# Patient Record
Sex: Female | Born: 1945
Health system: Southern US, Community
[De-identification: ages and names within clinical notes are randomized; demographics above are authoritative.]

---

## 2012-02-15 DIAGNOSIS — T7840XA Allergy, unspecified, initial encounter: Secondary | ICD-10-CM | POA: Diagnosis not present

## 2012-02-15 DIAGNOSIS — I10 Essential (primary) hypertension: Secondary | ICD-10-CM | POA: Diagnosis not present

## 2012-02-15 DIAGNOSIS — Z79899 Other long term (current) drug therapy: Secondary | ICD-10-CM | POA: Diagnosis not present

## 2012-02-15 DIAGNOSIS — T6391XA Toxic effect of contact with unspecified venomous animal, accidental (unintentional), initial encounter: Secondary | ICD-10-CM | POA: Diagnosis not present

## 2012-02-15 DIAGNOSIS — T63461A Toxic effect of venom of wasps, accidental (unintentional), initial encounter: Secondary | ICD-10-CM | POA: Diagnosis not present

## 2012-02-15 DIAGNOSIS — Z9889 Other specified postprocedural states: Secondary | ICD-10-CM | POA: Diagnosis not present

## 2012-02-15 DIAGNOSIS — Z880 Allergy status to penicillin: Secondary | ICD-10-CM | POA: Diagnosis not present

## 2012-03-19 DIAGNOSIS — E7800 Pure hypercholesterolemia, unspecified: Secondary | ICD-10-CM | POA: Diagnosis not present

## 2012-03-19 DIAGNOSIS — R82998 Other abnormal findings in urine: Secondary | ICD-10-CM | POA: Diagnosis not present

## 2012-03-19 DIAGNOSIS — I10 Essential (primary) hypertension: Secondary | ICD-10-CM | POA: Diagnosis not present

## 2012-03-19 DIAGNOSIS — R300 Dysuria: Secondary | ICD-10-CM | POA: Diagnosis not present

## 2012-03-19 DIAGNOSIS — R7309 Other abnormal glucose: Secondary | ICD-10-CM | POA: Diagnosis not present

## 2012-03-28 DIAGNOSIS — Z01818 Encounter for other preprocedural examination: Secondary | ICD-10-CM | POA: Diagnosis not present

## 2012-03-28 DIAGNOSIS — H2589 Other age-related cataract: Secondary | ICD-10-CM | POA: Diagnosis not present

## 2012-04-12 DIAGNOSIS — Z880 Allergy status to penicillin: Secondary | ICD-10-CM | POA: Diagnosis not present

## 2012-04-12 DIAGNOSIS — E7800 Pure hypercholesterolemia, unspecified: Secondary | ICD-10-CM | POA: Diagnosis not present

## 2012-04-12 DIAGNOSIS — I10 Essential (primary) hypertension: Secondary | ICD-10-CM | POA: Diagnosis not present

## 2012-04-12 DIAGNOSIS — Z86718 Personal history of other venous thrombosis and embolism: Secondary | ICD-10-CM | POA: Diagnosis not present

## 2012-04-12 DIAGNOSIS — H2589 Other age-related cataract: Secondary | ICD-10-CM | POA: Diagnosis not present

## 2012-04-12 DIAGNOSIS — H2510 Age-related nuclear cataract, unspecified eye: Secondary | ICD-10-CM | POA: Diagnosis not present

## 2012-04-19 DIAGNOSIS — Z880 Allergy status to penicillin: Secondary | ICD-10-CM | POA: Diagnosis not present

## 2012-04-19 DIAGNOSIS — H2510 Age-related nuclear cataract, unspecified eye: Secondary | ICD-10-CM | POA: Diagnosis not present

## 2012-04-19 DIAGNOSIS — E7800 Pure hypercholesterolemia, unspecified: Secondary | ICD-10-CM | POA: Diagnosis not present

## 2012-04-19 DIAGNOSIS — H2589 Other age-related cataract: Secondary | ICD-10-CM | POA: Diagnosis not present

## 2012-04-19 DIAGNOSIS — I10 Essential (primary) hypertension: Secondary | ICD-10-CM | POA: Diagnosis not present

## 2012-04-20 DIAGNOSIS — H33319 Horseshoe tear of retina without detachment, unspecified eye: Secondary | ICD-10-CM | POA: Diagnosis not present

## 2012-04-20 DIAGNOSIS — Z961 Presence of intraocular lens: Secondary | ICD-10-CM | POA: Diagnosis not present

## 2012-09-25 DIAGNOSIS — R5381 Other malaise: Secondary | ICD-10-CM | POA: Diagnosis not present

## 2012-09-25 DIAGNOSIS — Z9889 Other specified postprocedural states: Secondary | ICD-10-CM | POA: Diagnosis not present

## 2012-09-25 DIAGNOSIS — R5383 Other fatigue: Secondary | ICD-10-CM | POA: Diagnosis not present

## 2012-09-25 DIAGNOSIS — Z124 Encounter for screening for malignant neoplasm of cervix: Secondary | ICD-10-CM | POA: Diagnosis not present

## 2012-09-25 DIAGNOSIS — M1710 Unilateral primary osteoarthritis, unspecified knee: Secondary | ICD-10-CM | POA: Diagnosis not present

## 2012-09-25 DIAGNOSIS — I10 Essential (primary) hypertension: Secondary | ICD-10-CM | POA: Diagnosis not present

## 2012-09-25 DIAGNOSIS — E7800 Pure hypercholesterolemia, unspecified: Secondary | ICD-10-CM | POA: Diagnosis not present

## 2012-09-25 DIAGNOSIS — D179 Benign lipomatous neoplasm, unspecified: Secondary | ICD-10-CM | POA: Diagnosis not present

## 2012-09-25 DIAGNOSIS — R7309 Other abnormal glucose: Secondary | ICD-10-CM | POA: Diagnosis not present

## 2012-09-25 DIAGNOSIS — L259 Unspecified contact dermatitis, unspecified cause: Secondary | ICD-10-CM | POA: Diagnosis not present

## 2012-10-09 DIAGNOSIS — R922 Inconclusive mammogram: Secondary | ICD-10-CM | POA: Diagnosis not present

## 2012-10-09 DIAGNOSIS — Z1231 Encounter for screening mammogram for malignant neoplasm of breast: Secondary | ICD-10-CM | POA: Diagnosis not present

## 2013-02-25 DIAGNOSIS — H43399 Other vitreous opacities, unspecified eye: Secondary | ICD-10-CM | POA: Diagnosis not present

## 2013-02-25 DIAGNOSIS — H33309 Unspecified retinal break, unspecified eye: Secondary | ICD-10-CM | POA: Diagnosis not present

## 2013-02-25 DIAGNOSIS — H5310 Unspecified subjective visual disturbances: Secondary | ICD-10-CM | POA: Diagnosis not present

## 2013-02-25 DIAGNOSIS — Z961 Presence of intraocular lens: Secondary | ICD-10-CM | POA: Diagnosis not present

## 2013-03-25 DIAGNOSIS — M1710 Unilateral primary osteoarthritis, unspecified knee: Secondary | ICD-10-CM | POA: Diagnosis not present

## 2013-03-25 DIAGNOSIS — R7309 Other abnormal glucose: Secondary | ICD-10-CM | POA: Diagnosis not present

## 2013-03-25 DIAGNOSIS — E7800 Pure hypercholesterolemia, unspecified: Secondary | ICD-10-CM | POA: Diagnosis not present

## 2013-03-25 DIAGNOSIS — L259 Unspecified contact dermatitis, unspecified cause: Secondary | ICD-10-CM | POA: Diagnosis not present

## 2013-03-25 DIAGNOSIS — D179 Benign lipomatous neoplasm, unspecified: Secondary | ICD-10-CM | POA: Diagnosis not present

## 2013-03-25 DIAGNOSIS — Z9889 Other specified postprocedural states: Secondary | ICD-10-CM | POA: Diagnosis not present

## 2013-03-25 DIAGNOSIS — I10 Essential (primary) hypertension: Secondary | ICD-10-CM | POA: Diagnosis not present

## 2013-06-12 DIAGNOSIS — H33009 Unspecified retinal detachment with retinal break, unspecified eye: Secondary | ICD-10-CM | POA: Diagnosis not present

## 2013-06-12 DIAGNOSIS — H43819 Vitreous degeneration, unspecified eye: Secondary | ICD-10-CM | POA: Diagnosis not present

## 2013-06-12 DIAGNOSIS — Z961 Presence of intraocular lens: Secondary | ICD-10-CM | POA: Diagnosis not present

## 2013-09-17 DIAGNOSIS — D235 Other benign neoplasm of skin of trunk: Secondary | ICD-10-CM | POA: Diagnosis not present

## 2013-09-26 DIAGNOSIS — M1710 Unilateral primary osteoarthritis, unspecified knee: Secondary | ICD-10-CM | POA: Diagnosis not present

## 2013-09-26 DIAGNOSIS — I10 Essential (primary) hypertension: Secondary | ICD-10-CM | POA: Diagnosis not present

## 2013-09-26 DIAGNOSIS — R7309 Other abnormal glucose: Secondary | ICD-10-CM | POA: Diagnosis not present

## 2013-09-26 DIAGNOSIS — Z124 Encounter for screening for malignant neoplasm of cervix: Secondary | ICD-10-CM | POA: Diagnosis not present

## 2013-09-26 DIAGNOSIS — E7800 Pure hypercholesterolemia, unspecified: Secondary | ICD-10-CM | POA: Diagnosis not present

## 2013-10-14 DIAGNOSIS — E7800 Pure hypercholesterolemia, unspecified: Secondary | ICD-10-CM | POA: Diagnosis not present

## 2013-10-14 DIAGNOSIS — I10 Essential (primary) hypertension: Secondary | ICD-10-CM | POA: Diagnosis not present

## 2013-10-14 DIAGNOSIS — M1710 Unilateral primary osteoarthritis, unspecified knee: Secondary | ICD-10-CM | POA: Diagnosis not present

## 2013-10-14 DIAGNOSIS — R7309 Other abnormal glucose: Secondary | ICD-10-CM | POA: Diagnosis not present

## 2013-12-18 DIAGNOSIS — Z1231 Encounter for screening mammogram for malignant neoplasm of breast: Secondary | ICD-10-CM | POA: Diagnosis not present

## 2014-03-25 DIAGNOSIS — L259 Unspecified contact dermatitis, unspecified cause: Secondary | ICD-10-CM | POA: Diagnosis not present

## 2014-03-25 DIAGNOSIS — E7800 Pure hypercholesterolemia, unspecified: Secondary | ICD-10-CM | POA: Diagnosis not present

## 2014-03-25 DIAGNOSIS — R7309 Other abnormal glucose: Secondary | ICD-10-CM | POA: Diagnosis not present

## 2014-03-25 DIAGNOSIS — Z8742 Personal history of other diseases of the female genital tract: Secondary | ICD-10-CM | POA: Diagnosis not present

## 2014-03-25 DIAGNOSIS — I10 Essential (primary) hypertension: Secondary | ICD-10-CM | POA: Diagnosis not present

## 2014-03-25 DIAGNOSIS — Z9889 Other specified postprocedural states: Secondary | ICD-10-CM | POA: Diagnosis not present

## 2014-03-25 DIAGNOSIS — R195 Other fecal abnormalities: Secondary | ICD-10-CM | POA: Diagnosis not present

## 2014-03-25 DIAGNOSIS — M1710 Unilateral primary osteoarthritis, unspecified knee: Secondary | ICD-10-CM | POA: Diagnosis not present

## 2014-03-25 DIAGNOSIS — D179 Benign lipomatous neoplasm, unspecified: Secondary | ICD-10-CM | POA: Diagnosis not present

## 2014-03-31 DIAGNOSIS — E7800 Pure hypercholesterolemia, unspecified: Secondary | ICD-10-CM | POA: Diagnosis not present

## 2014-03-31 DIAGNOSIS — I10 Essential (primary) hypertension: Secondary | ICD-10-CM | POA: Diagnosis not present

## 2014-03-31 DIAGNOSIS — K921 Melena: Secondary | ICD-10-CM | POA: Diagnosis not present

## 2014-03-31 DIAGNOSIS — R7309 Other abnormal glucose: Secondary | ICD-10-CM | POA: Diagnosis not present

## 2014-06-18 DIAGNOSIS — H5212 Myopia, left eye: Secondary | ICD-10-CM | POA: Diagnosis not present

## 2014-06-18 DIAGNOSIS — Z961 Presence of intraocular lens: Secondary | ICD-10-CM | POA: Diagnosis not present

## 2014-06-18 DIAGNOSIS — H33301 Unspecified retinal break, right eye: Secondary | ICD-10-CM | POA: Diagnosis not present

## 2014-06-18 DIAGNOSIS — H524 Presbyopia: Secondary | ICD-10-CM | POA: Diagnosis not present

## 2014-10-02 DIAGNOSIS — R002 Palpitations: Secondary | ICD-10-CM | POA: Diagnosis not present

## 2014-10-02 DIAGNOSIS — R739 Hyperglycemia, unspecified: Secondary | ICD-10-CM | POA: Diagnosis not present

## 2014-10-02 DIAGNOSIS — E780 Pure hypercholesterolemia: Secondary | ICD-10-CM | POA: Diagnosis not present

## 2014-10-02 DIAGNOSIS — Z01419 Encounter for gynecological examination (general) (routine) without abnormal findings: Secondary | ICD-10-CM | POA: Diagnosis not present

## 2014-10-02 DIAGNOSIS — Z1289 Encounter for screening for malignant neoplasm of other sites: Secondary | ICD-10-CM | POA: Diagnosis not present

## 2014-10-02 DIAGNOSIS — I10 Essential (primary) hypertension: Secondary | ICD-10-CM | POA: Diagnosis not present

## 2014-10-03 DIAGNOSIS — R739 Hyperglycemia, unspecified: Secondary | ICD-10-CM | POA: Diagnosis not present

## 2014-10-03 DIAGNOSIS — R002 Palpitations: Secondary | ICD-10-CM | POA: Diagnosis not present

## 2014-10-03 DIAGNOSIS — E780 Pure hypercholesterolemia: Secondary | ICD-10-CM | POA: Diagnosis not present

## 2014-10-03 DIAGNOSIS — I10 Essential (primary) hypertension: Secondary | ICD-10-CM | POA: Diagnosis not present

## 2014-10-20 DIAGNOSIS — Z79899 Other long term (current) drug therapy: Secondary | ICD-10-CM | POA: Diagnosis not present

## 2014-10-20 DIAGNOSIS — Z1211 Encounter for screening for malignant neoplasm of colon: Secondary | ICD-10-CM | POA: Diagnosis not present

## 2014-12-30 DIAGNOSIS — D751 Secondary polycythemia: Secondary | ICD-10-CM | POA: Diagnosis not present

## 2014-12-30 DIAGNOSIS — Z86718 Personal history of other venous thrombosis and embolism: Secondary | ICD-10-CM | POA: Diagnosis not present

## 2014-12-30 DIAGNOSIS — E279 Disorder of adrenal gland, unspecified: Secondary | ICD-10-CM | POA: Diagnosis not present

## 2014-12-30 DIAGNOSIS — E282 Polycystic ovarian syndrome: Secondary | ICD-10-CM | POA: Diagnosis not present

## 2014-12-30 DIAGNOSIS — R5383 Other fatigue: Secondary | ICD-10-CM | POA: Diagnosis not present

## 2014-12-30 DIAGNOSIS — I10 Essential (primary) hypertension: Secondary | ICD-10-CM | POA: Diagnosis not present

## 2014-12-30 DIAGNOSIS — Z832 Family history of diseases of the blood and blood-forming organs and certain disorders involving the immune mechanism: Secondary | ICD-10-CM | POA: Diagnosis not present

## 2015-01-06 DIAGNOSIS — D751 Secondary polycythemia: Secondary | ICD-10-CM | POA: Diagnosis not present

## 2015-01-06 DIAGNOSIS — J984 Other disorders of lung: Secondary | ICD-10-CM | POA: Diagnosis not present

## 2015-01-12 DIAGNOSIS — Z832 Family history of diseases of the blood and blood-forming organs and certain disorders involving the immune mechanism: Secondary | ICD-10-CM | POA: Diagnosis not present

## 2015-01-12 DIAGNOSIS — D751 Secondary polycythemia: Secondary | ICD-10-CM | POA: Diagnosis not present

## 2015-01-12 DIAGNOSIS — R5383 Other fatigue: Secondary | ICD-10-CM | POA: Diagnosis not present

## 2015-03-23 DIAGNOSIS — R739 Hyperglycemia, unspecified: Secondary | ICD-10-CM | POA: Diagnosis not present

## 2015-03-23 DIAGNOSIS — E780 Pure hypercholesterolemia: Secondary | ICD-10-CM | POA: Diagnosis not present

## 2015-03-23 DIAGNOSIS — R718 Other abnormality of red blood cells: Secondary | ICD-10-CM | POA: Diagnosis not present

## 2015-03-23 DIAGNOSIS — I10 Essential (primary) hypertension: Secondary | ICD-10-CM | POA: Diagnosis not present

## 2015-03-23 DIAGNOSIS — D582 Other hemoglobinopathies: Secondary | ICD-10-CM | POA: Diagnosis not present

## 2015-04-06 DIAGNOSIS — D751 Secondary polycythemia: Secondary | ICD-10-CM | POA: Diagnosis not present

## 2015-04-06 DIAGNOSIS — Z8742 Personal history of other diseases of the female genital tract: Secondary | ICD-10-CM | POA: Diagnosis not present

## 2015-04-06 DIAGNOSIS — Z09 Encounter for follow-up examination after completed treatment for conditions other than malignant neoplasm: Secondary | ICD-10-CM | POA: Diagnosis not present

## 2015-04-20 DIAGNOSIS — Z1231 Encounter for screening mammogram for malignant neoplasm of breast: Secondary | ICD-10-CM | POA: Diagnosis not present

## 2015-07-07 DIAGNOSIS — D751 Secondary polycythemia: Secondary | ICD-10-CM | POA: Diagnosis not present

## 2015-08-06 DIAGNOSIS — D751 Secondary polycythemia: Secondary | ICD-10-CM | POA: Diagnosis not present

## 2015-10-02 DIAGNOSIS — Z91038 Other insect allergy status: Secondary | ICD-10-CM | POA: Diagnosis not present

## 2015-10-02 DIAGNOSIS — Z23 Encounter for immunization: Secondary | ICD-10-CM | POA: Diagnosis not present

## 2015-10-02 DIAGNOSIS — Z1289 Encounter for screening for malignant neoplasm of other sites: Secondary | ICD-10-CM | POA: Diagnosis not present

## 2015-10-02 DIAGNOSIS — R739 Hyperglycemia, unspecified: Secondary | ICD-10-CM | POA: Diagnosis not present

## 2015-10-02 DIAGNOSIS — I10 Essential (primary) hypertension: Secondary | ICD-10-CM | POA: Diagnosis not present

## 2015-10-02 DIAGNOSIS — E7800 Pure hypercholesterolemia, unspecified: Secondary | ICD-10-CM | POA: Diagnosis not present

## 2015-10-02 DIAGNOSIS — D751 Secondary polycythemia: Secondary | ICD-10-CM | POA: Diagnosis not present

## 2015-10-02 DIAGNOSIS — Z0000 Encounter for general adult medical examination without abnormal findings: Secondary | ICD-10-CM | POA: Diagnosis not present

## 2015-10-06 DIAGNOSIS — Z1211 Encounter for screening for malignant neoplasm of colon: Secondary | ICD-10-CM | POA: Diagnosis not present

## 2015-10-06 DIAGNOSIS — Z79899 Other long term (current) drug therapy: Secondary | ICD-10-CM | POA: Diagnosis not present

## 2015-10-06 DIAGNOSIS — Z0000 Encounter for general adult medical examination without abnormal findings: Secondary | ICD-10-CM | POA: Diagnosis not present

## 2015-10-07 DIAGNOSIS — R828 Abnormal findings on cytological and histological examination of urine: Secondary | ICD-10-CM | POA: Diagnosis not present

## 2015-10-20 DIAGNOSIS — D751 Secondary polycythemia: Secondary | ICD-10-CM | POA: Diagnosis not present

## 2015-10-20 DIAGNOSIS — D649 Anemia, unspecified: Secondary | ICD-10-CM | POA: Diagnosis not present

## 2015-10-27 DIAGNOSIS — R828 Abnormal findings on cytological and histological examination of urine: Secondary | ICD-10-CM | POA: Diagnosis not present

## 2015-10-28 DIAGNOSIS — R828 Abnormal findings on cytological and histological examination of urine: Secondary | ICD-10-CM | POA: Diagnosis not present

## 2015-11-03 DIAGNOSIS — H43811 Vitreous degeneration, right eye: Secondary | ICD-10-CM | POA: Diagnosis not present

## 2015-11-03 DIAGNOSIS — H26491 Other secondary cataract, right eye: Secondary | ICD-10-CM | POA: Diagnosis not present

## 2015-11-03 DIAGNOSIS — Z961 Presence of intraocular lens: Secondary | ICD-10-CM | POA: Diagnosis not present

## 2015-11-03 DIAGNOSIS — H33311 Horseshoe tear of retina without detachment, right eye: Secondary | ICD-10-CM | POA: Diagnosis not present

## 2015-11-06 DIAGNOSIS — H43811 Vitreous degeneration, right eye: Secondary | ICD-10-CM | POA: Diagnosis not present

## 2015-11-06 DIAGNOSIS — Z961 Presence of intraocular lens: Secondary | ICD-10-CM | POA: Diagnosis not present

## 2015-11-06 DIAGNOSIS — H33311 Horseshoe tear of retina without detachment, right eye: Secondary | ICD-10-CM | POA: Diagnosis not present

## 2015-11-06 DIAGNOSIS — H26491 Other secondary cataract, right eye: Secondary | ICD-10-CM | POA: Diagnosis not present

## 2015-11-06 DIAGNOSIS — H31001 Unspecified chorioretinal scars, right eye: Secondary | ICD-10-CM | POA: Diagnosis not present

## 2015-11-10 DIAGNOSIS — D751 Secondary polycythemia: Secondary | ICD-10-CM | POA: Diagnosis not present

## 2015-11-10 DIAGNOSIS — D696 Thrombocytopenia, unspecified: Secondary | ICD-10-CM | POA: Diagnosis not present

## 2015-11-19 DIAGNOSIS — Z1211 Encounter for screening for malignant neoplasm of colon: Secondary | ICD-10-CM | POA: Diagnosis not present

## 2015-11-19 DIAGNOSIS — Z79899 Other long term (current) drug therapy: Secondary | ICD-10-CM | POA: Diagnosis not present

## 2015-12-08 DIAGNOSIS — D751 Secondary polycythemia: Secondary | ICD-10-CM | POA: Diagnosis not present

## 2016-01-05 DIAGNOSIS — D751 Secondary polycythemia: Secondary | ICD-10-CM | POA: Diagnosis not present

## 2016-02-02 DIAGNOSIS — D751 Secondary polycythemia: Secondary | ICD-10-CM | POA: Diagnosis not present

## 2016-03-08 DIAGNOSIS — D751 Secondary polycythemia: Secondary | ICD-10-CM | POA: Diagnosis not present

## 2016-03-15 DIAGNOSIS — H33311 Horseshoe tear of retina without detachment, right eye: Secondary | ICD-10-CM | POA: Diagnosis not present

## 2016-03-15 DIAGNOSIS — H26491 Other secondary cataract, right eye: Secondary | ICD-10-CM | POA: Diagnosis not present

## 2016-03-15 DIAGNOSIS — H43811 Vitreous degeneration, right eye: Secondary | ICD-10-CM | POA: Diagnosis not present

## 2016-03-15 DIAGNOSIS — Z961 Presence of intraocular lens: Secondary | ICD-10-CM | POA: Diagnosis not present

## 2016-03-15 DIAGNOSIS — H31001 Unspecified chorioretinal scars, right eye: Secondary | ICD-10-CM | POA: Diagnosis not present

## 2016-03-31 DIAGNOSIS — R739 Hyperglycemia, unspecified: Secondary | ICD-10-CM | POA: Diagnosis not present

## 2016-03-31 DIAGNOSIS — R195 Other fecal abnormalities: Secondary | ICD-10-CM | POA: Diagnosis not present

## 2016-03-31 DIAGNOSIS — I10 Essential (primary) hypertension: Secondary | ICD-10-CM | POA: Diagnosis not present

## 2016-03-31 DIAGNOSIS — E7800 Pure hypercholesterolemia, unspecified: Secondary | ICD-10-CM | POA: Diagnosis not present

## 2016-03-31 DIAGNOSIS — R002 Palpitations: Secondary | ICD-10-CM | POA: Diagnosis not present

## 2016-04-05 DIAGNOSIS — D751 Secondary polycythemia: Secondary | ICD-10-CM | POA: Diagnosis not present

## 2016-05-03 DIAGNOSIS — D751 Secondary polycythemia: Secondary | ICD-10-CM | POA: Diagnosis not present

## 2016-05-31 DIAGNOSIS — D751 Secondary polycythemia: Secondary | ICD-10-CM | POA: Diagnosis not present

## 2016-06-16 DIAGNOSIS — H43813 Vitreous degeneration, bilateral: Secondary | ICD-10-CM | POA: Diagnosis not present

## 2016-06-16 DIAGNOSIS — H33311 Horseshoe tear of retina without detachment, right eye: Secondary | ICD-10-CM | POA: Diagnosis not present

## 2016-06-16 DIAGNOSIS — H527 Unspecified disorder of refraction: Secondary | ICD-10-CM | POA: Diagnosis not present

## 2016-06-16 DIAGNOSIS — Z961 Presence of intraocular lens: Secondary | ICD-10-CM | POA: Diagnosis not present

## 2016-06-17 DIAGNOSIS — Z1231 Encounter for screening mammogram for malignant neoplasm of breast: Secondary | ICD-10-CM | POA: Diagnosis not present

## 2016-06-28 DIAGNOSIS — D751 Secondary polycythemia: Secondary | ICD-10-CM | POA: Diagnosis not present

## 2016-08-12 DIAGNOSIS — D751 Secondary polycythemia: Secondary | ICD-10-CM | POA: Diagnosis not present

## 2016-09-26 DIAGNOSIS — D751 Secondary polycythemia: Secondary | ICD-10-CM | POA: Diagnosis not present

## 2016-10-04 DIAGNOSIS — Z1289 Encounter for screening for malignant neoplasm of other sites: Secondary | ICD-10-CM | POA: Diagnosis not present

## 2016-10-04 DIAGNOSIS — Z23 Encounter for immunization: Secondary | ICD-10-CM | POA: Diagnosis not present

## 2016-10-04 DIAGNOSIS — I10 Essential (primary) hypertension: Secondary | ICD-10-CM | POA: Diagnosis not present

## 2016-10-04 DIAGNOSIS — Z0000 Encounter for general adult medical examination without abnormal findings: Secondary | ICD-10-CM | POA: Diagnosis not present

## 2016-10-04 DIAGNOSIS — D751 Secondary polycythemia: Secondary | ICD-10-CM | POA: Diagnosis not present

## 2016-10-04 DIAGNOSIS — E7800 Pure hypercholesterolemia, unspecified: Secondary | ICD-10-CM | POA: Diagnosis not present

## 2016-10-04 DIAGNOSIS — R739 Hyperglycemia, unspecified: Secondary | ICD-10-CM | POA: Diagnosis not present

## 2016-10-04 DIAGNOSIS — R5383 Other fatigue: Secondary | ICD-10-CM | POA: Diagnosis not present

## 2016-10-05 DIAGNOSIS — R7309 Other abnormal glucose: Secondary | ICD-10-CM | POA: Diagnosis not present

## 2016-12-13 DIAGNOSIS — R42 Dizziness and giddiness: Secondary | ICD-10-CM | POA: Diagnosis not present

## 2016-12-13 DIAGNOSIS — D751 Secondary polycythemia: Secondary | ICD-10-CM | POA: Diagnosis not present

## 2017-02-17 DIAGNOSIS — D751 Secondary polycythemia: Secondary | ICD-10-CM | POA: Diagnosis not present

## 2017-03-17 DIAGNOSIS — D751 Secondary polycythemia: Secondary | ICD-10-CM | POA: Diagnosis not present

## 2017-04-04 DIAGNOSIS — D751 Secondary polycythemia: Secondary | ICD-10-CM | POA: Diagnosis not present

## 2017-04-04 DIAGNOSIS — I10 Essential (primary) hypertension: Secondary | ICD-10-CM | POA: Diagnosis not present

## 2017-04-04 DIAGNOSIS — Z91038 Other insect allergy status: Secondary | ICD-10-CM | POA: Diagnosis not present

## 2017-04-04 DIAGNOSIS — E7800 Pure hypercholesterolemia, unspecified: Secondary | ICD-10-CM | POA: Diagnosis not present

## 2017-04-18 DIAGNOSIS — D751 Secondary polycythemia: Secondary | ICD-10-CM | POA: Diagnosis not present

## 2017-04-18 DIAGNOSIS — Z85828 Personal history of other malignant neoplasm of skin: Secondary | ICD-10-CM | POA: Diagnosis not present

## 2017-04-18 DIAGNOSIS — I10 Essential (primary) hypertension: Secondary | ICD-10-CM | POA: Diagnosis not present

## 2017-04-18 DIAGNOSIS — Z86718 Personal history of other venous thrombosis and embolism: Secondary | ICD-10-CM | POA: Diagnosis not present

## 2017-05-15 DIAGNOSIS — D751 Secondary polycythemia: Secondary | ICD-10-CM | POA: Diagnosis not present

## 2017-06-12 DIAGNOSIS — D751 Secondary polycythemia: Secondary | ICD-10-CM | POA: Diagnosis not present

## 2017-06-28 DIAGNOSIS — H33311 Horseshoe tear of retina without detachment, right eye: Secondary | ICD-10-CM | POA: Diagnosis not present

## 2017-06-28 DIAGNOSIS — Z961 Presence of intraocular lens: Secondary | ICD-10-CM | POA: Diagnosis not present

## 2017-06-28 DIAGNOSIS — H43813 Vitreous degeneration, bilateral: Secondary | ICD-10-CM | POA: Diagnosis not present

## 2017-06-28 DIAGNOSIS — H527 Unspecified disorder of refraction: Secondary | ICD-10-CM | POA: Diagnosis not present

## 2017-07-10 DIAGNOSIS — I10 Essential (primary) hypertension: Secondary | ICD-10-CM | POA: Diagnosis not present

## 2017-07-10 DIAGNOSIS — Z85828 Personal history of other malignant neoplasm of skin: Secondary | ICD-10-CM | POA: Diagnosis not present

## 2017-07-10 DIAGNOSIS — D751 Secondary polycythemia: Secondary | ICD-10-CM | POA: Diagnosis not present

## 2017-09-11 DIAGNOSIS — D751 Secondary polycythemia: Secondary | ICD-10-CM | POA: Diagnosis not present

## 2017-10-10 DIAGNOSIS — D751 Secondary polycythemia: Secondary | ICD-10-CM | POA: Diagnosis not present

## 2017-10-10 DIAGNOSIS — E7800 Pure hypercholesterolemia, unspecified: Secondary | ICD-10-CM | POA: Diagnosis not present

## 2017-10-10 DIAGNOSIS — I10 Essential (primary) hypertension: Secondary | ICD-10-CM | POA: Diagnosis not present

## 2017-10-10 DIAGNOSIS — R5383 Other fatigue: Secondary | ICD-10-CM | POA: Diagnosis not present

## 2017-10-10 DIAGNOSIS — Z1231 Encounter for screening mammogram for malignant neoplasm of breast: Secondary | ICD-10-CM | POA: Diagnosis not present

## 2017-10-10 DIAGNOSIS — R739 Hyperglycemia, unspecified: Secondary | ICD-10-CM | POA: Diagnosis not present

## 2017-10-10 DIAGNOSIS — Z0000 Encounter for general adult medical examination without abnormal findings: Secondary | ICD-10-CM | POA: Diagnosis not present

## 2017-10-12 DIAGNOSIS — E7800 Pure hypercholesterolemia, unspecified: Secondary | ICD-10-CM | POA: Diagnosis not present

## 2017-10-12 DIAGNOSIS — R739 Hyperglycemia, unspecified: Secondary | ICD-10-CM | POA: Diagnosis not present

## 2017-10-12 DIAGNOSIS — D751 Secondary polycythemia: Secondary | ICD-10-CM | POA: Diagnosis not present

## 2017-10-12 DIAGNOSIS — R5383 Other fatigue: Secondary | ICD-10-CM | POA: Diagnosis not present

## 2017-10-26 DIAGNOSIS — G250 Essential tremor: Secondary | ICD-10-CM | POA: Diagnosis not present

## 2017-10-26 DIAGNOSIS — G603 Idiopathic progressive neuropathy: Secondary | ICD-10-CM | POA: Diagnosis not present

## 2017-10-28 DIAGNOSIS — Z1231 Encounter for screening mammogram for malignant neoplasm of breast: Secondary | ICD-10-CM | POA: Diagnosis not present

## 2017-11-06 DIAGNOSIS — D751 Secondary polycythemia: Secondary | ICD-10-CM | POA: Diagnosis not present

## 2017-11-06 DIAGNOSIS — I10 Essential (primary) hypertension: Secondary | ICD-10-CM | POA: Diagnosis not present

## 2017-11-06 DIAGNOSIS — Z832 Family history of diseases of the blood and blood-forming organs and certain disorders involving the immune mechanism: Secondary | ICD-10-CM | POA: Diagnosis not present

## 2018-01-08 DIAGNOSIS — D751 Secondary polycythemia: Secondary | ICD-10-CM | POA: Diagnosis not present

## 2018-01-11 DIAGNOSIS — K1120 Sialoadenitis, unspecified: Secondary | ICD-10-CM | POA: Diagnosis not present

## 2018-02-27 DIAGNOSIS — R739 Hyperglycemia, unspecified: Secondary | ICD-10-CM | POA: Diagnosis not present

## 2018-02-27 DIAGNOSIS — L309 Dermatitis, unspecified: Secondary | ICD-10-CM | POA: Diagnosis not present

## 2018-02-27 DIAGNOSIS — E119 Type 2 diabetes mellitus without complications: Secondary | ICD-10-CM | POA: Diagnosis not present

## 2018-02-27 DIAGNOSIS — E7800 Pure hypercholesterolemia, unspecified: Secondary | ICD-10-CM | POA: Diagnosis not present

## 2018-02-27 DIAGNOSIS — Z91038 Other insect allergy status: Secondary | ICD-10-CM | POA: Diagnosis not present

## 2018-02-27 DIAGNOSIS — D751 Secondary polycythemia: Secondary | ICD-10-CM | POA: Diagnosis not present

## 2018-02-27 DIAGNOSIS — I10 Essential (primary) hypertension: Secondary | ICD-10-CM | POA: Diagnosis not present

## 2018-03-12 DIAGNOSIS — Z6831 Body mass index (BMI) 31.0-31.9, adult: Secondary | ICD-10-CM | POA: Diagnosis not present

## 2018-03-12 DIAGNOSIS — E6609 Other obesity due to excess calories: Secondary | ICD-10-CM | POA: Diagnosis not present

## 2018-03-12 DIAGNOSIS — I10 Essential (primary) hypertension: Secondary | ICD-10-CM | POA: Diagnosis not present

## 2018-03-12 DIAGNOSIS — D751 Secondary polycythemia: Secondary | ICD-10-CM | POA: Diagnosis not present

## 2018-05-14 DIAGNOSIS — D751 Secondary polycythemia: Secondary | ICD-10-CM | POA: Diagnosis not present

## 2018-06-18 DIAGNOSIS — E7800 Pure hypercholesterolemia, unspecified: Secondary | ICD-10-CM | POA: Diagnosis not present

## 2018-06-18 DIAGNOSIS — E119 Type 2 diabetes mellitus without complications: Secondary | ICD-10-CM | POA: Diagnosis not present

## 2018-06-18 DIAGNOSIS — I10 Essential (primary) hypertension: Secondary | ICD-10-CM | POA: Diagnosis not present

## 2018-06-18 DIAGNOSIS — Z91038 Other insect allergy status: Secondary | ICD-10-CM | POA: Diagnosis not present

## 2018-07-12 DIAGNOSIS — D751 Secondary polycythemia: Secondary | ICD-10-CM | POA: Diagnosis not present

## 2018-08-17 DIAGNOSIS — Z961 Presence of intraocular lens: Secondary | ICD-10-CM | POA: Diagnosis not present

## 2018-08-17 DIAGNOSIS — H33311 Horseshoe tear of retina without detachment, right eye: Secondary | ICD-10-CM | POA: Diagnosis not present

## 2018-08-17 DIAGNOSIS — H43813 Vitreous degeneration, bilateral: Secondary | ICD-10-CM | POA: Diagnosis not present

## 2018-08-17 DIAGNOSIS — H524 Presbyopia: Secondary | ICD-10-CM | POA: Diagnosis not present

## 2018-09-12 DIAGNOSIS — Z85828 Personal history of other malignant neoplasm of skin: Secondary | ICD-10-CM | POA: Diagnosis not present

## 2018-09-12 DIAGNOSIS — Z6828 Body mass index (BMI) 28.0-28.9, adult: Secondary | ICD-10-CM | POA: Diagnosis not present

## 2018-09-12 DIAGNOSIS — D751 Secondary polycythemia: Secondary | ICD-10-CM | POA: Diagnosis not present

## 2018-09-12 DIAGNOSIS — E282 Polycystic ovarian syndrome: Secondary | ICD-10-CM | POA: Diagnosis not present

## 2018-09-12 DIAGNOSIS — Z86718 Personal history of other venous thrombosis and embolism: Secondary | ICD-10-CM | POA: Diagnosis not present

## 2018-09-12 DIAGNOSIS — I10 Essential (primary) hypertension: Secondary | ICD-10-CM | POA: Diagnosis not present

## 2018-09-12 DIAGNOSIS — E669 Obesity, unspecified: Secondary | ICD-10-CM | POA: Diagnosis not present

## 2018-10-12 DIAGNOSIS — E1169 Type 2 diabetes mellitus with other specified complication: Secondary | ICD-10-CM | POA: Diagnosis not present

## 2018-10-12 DIAGNOSIS — Z1231 Encounter for screening mammogram for malignant neoplasm of breast: Secondary | ICD-10-CM | POA: Diagnosis not present

## 2018-10-12 DIAGNOSIS — R5383 Other fatigue: Secondary | ICD-10-CM | POA: Diagnosis not present

## 2018-10-12 DIAGNOSIS — I10 Essential (primary) hypertension: Secondary | ICD-10-CM | POA: Diagnosis not present

## 2018-10-12 DIAGNOSIS — Z0000 Encounter for general adult medical examination without abnormal findings: Secondary | ICD-10-CM | POA: Diagnosis not present

## 2018-10-12 DIAGNOSIS — Z91038 Other insect allergy status: Secondary | ICD-10-CM | POA: Diagnosis not present

## 2018-10-12 DIAGNOSIS — D751 Secondary polycythemia: Secondary | ICD-10-CM | POA: Diagnosis not present

## 2018-10-12 DIAGNOSIS — E7800 Pure hypercholesterolemia, unspecified: Secondary | ICD-10-CM | POA: Diagnosis not present

## 2018-10-12 DIAGNOSIS — E1140 Type 2 diabetes mellitus with diabetic neuropathy, unspecified: Secondary | ICD-10-CM | POA: Diagnosis not present

## 2018-10-15 DIAGNOSIS — E1169 Type 2 diabetes mellitus with other specified complication: Secondary | ICD-10-CM | POA: Diagnosis not present

## 2018-10-15 DIAGNOSIS — E7800 Pure hypercholesterolemia, unspecified: Secondary | ICD-10-CM | POA: Diagnosis not present

## 2018-10-15 DIAGNOSIS — R5383 Other fatigue: Secondary | ICD-10-CM | POA: Diagnosis not present

## 2018-10-15 DIAGNOSIS — D751 Secondary polycythemia: Secondary | ICD-10-CM | POA: Diagnosis not present

## 2018-10-15 DIAGNOSIS — I10 Essential (primary) hypertension: Secondary | ICD-10-CM | POA: Diagnosis not present

## 2018-10-22 DIAGNOSIS — D751 Secondary polycythemia: Secondary | ICD-10-CM | POA: Diagnosis not present

## 2018-11-26 DIAGNOSIS — L638 Other alopecia areata: Secondary | ICD-10-CM | POA: Diagnosis not present

## 2018-11-28 DIAGNOSIS — E559 Vitamin D deficiency, unspecified: Secondary | ICD-10-CM | POA: Diagnosis not present

## 2018-11-28 DIAGNOSIS — D510 Vitamin B12 deficiency anemia due to intrinsic factor deficiency: Secondary | ICD-10-CM | POA: Diagnosis not present

## 2018-11-28 DIAGNOSIS — R531 Weakness: Secondary | ICD-10-CM | POA: Diagnosis not present

## 2018-12-10 DIAGNOSIS — L638 Other alopecia areata: Secondary | ICD-10-CM | POA: Diagnosis not present

## 2019-01-01 DIAGNOSIS — Z1231 Encounter for screening mammogram for malignant neoplasm of breast: Secondary | ICD-10-CM | POA: Diagnosis not present

## 2019-01-16 DIAGNOSIS — D751 Secondary polycythemia: Secondary | ICD-10-CM | POA: Diagnosis not present

## 2019-02-22 DIAGNOSIS — R3915 Urgency of urination: Secondary | ICD-10-CM | POA: Diagnosis not present

## 2019-02-22 DIAGNOSIS — R350 Frequency of micturition: Secondary | ICD-10-CM | POA: Diagnosis not present

## 2019-02-22 DIAGNOSIS — I10 Essential (primary) hypertension: Secondary | ICD-10-CM | POA: Diagnosis not present

## 2019-02-22 DIAGNOSIS — G629 Polyneuropathy, unspecified: Secondary | ICD-10-CM | POA: Diagnosis not present

## 2019-02-22 DIAGNOSIS — E7800 Pure hypercholesterolemia, unspecified: Secondary | ICD-10-CM | POA: Diagnosis not present

## 2019-02-22 DIAGNOSIS — E1140 Type 2 diabetes mellitus with diabetic neuropathy, unspecified: Secondary | ICD-10-CM | POA: Diagnosis not present

## 2019-02-22 DIAGNOSIS — R300 Dysuria: Secondary | ICD-10-CM | POA: Diagnosis not present

## 2019-03-13 DIAGNOSIS — R42 Dizziness and giddiness: Secondary | ICD-10-CM | POA: Diagnosis not present

## 2019-03-13 DIAGNOSIS — D751 Secondary polycythemia: Secondary | ICD-10-CM | POA: Diagnosis not present

## 2019-03-13 DIAGNOSIS — I10 Essential (primary) hypertension: Secondary | ICD-10-CM | POA: Diagnosis not present

## 2019-05-14 DIAGNOSIS — D751 Secondary polycythemia: Secondary | ICD-10-CM | POA: Diagnosis not present

## 2019-07-03 DIAGNOSIS — D751 Secondary polycythemia: Secondary | ICD-10-CM | POA: Diagnosis not present

## 2019-07-03 DIAGNOSIS — I10 Essential (primary) hypertension: Secondary | ICD-10-CM | POA: Diagnosis not present

## 2019-07-03 DIAGNOSIS — L309 Dermatitis, unspecified: Secondary | ICD-10-CM | POA: Diagnosis not present

## 2019-07-03 DIAGNOSIS — G629 Polyneuropathy, unspecified: Secondary | ICD-10-CM | POA: Diagnosis not present

## 2019-07-11 DIAGNOSIS — M25552 Pain in left hip: Secondary | ICD-10-CM | POA: Diagnosis not present

## 2019-07-15 DIAGNOSIS — M1612 Unilateral primary osteoarthritis, left hip: Secondary | ICD-10-CM | POA: Diagnosis not present

## 2019-07-15 DIAGNOSIS — M24152 Other articular cartilage disorders, left hip: Secondary | ICD-10-CM | POA: Diagnosis not present

## 2019-07-15 DIAGNOSIS — M25552 Pain in left hip: Secondary | ICD-10-CM | POA: Diagnosis not present

## 2019-07-16 DIAGNOSIS — D751 Secondary polycythemia: Secondary | ICD-10-CM | POA: Diagnosis not present

## 2019-07-26 DIAGNOSIS — M25552 Pain in left hip: Secondary | ICD-10-CM | POA: Diagnosis not present

## 2019-08-23 DIAGNOSIS — H43813 Vitreous degeneration, bilateral: Secondary | ICD-10-CM | POA: Diagnosis not present

## 2019-08-23 DIAGNOSIS — Z961 Presence of intraocular lens: Secondary | ICD-10-CM | POA: Diagnosis not present

## 2019-08-23 DIAGNOSIS — H524 Presbyopia: Secondary | ICD-10-CM | POA: Diagnosis not present

## 2019-08-23 DIAGNOSIS — H33311 Horseshoe tear of retina without detachment, right eye: Secondary | ICD-10-CM | POA: Diagnosis not present

## 2019-09-02 ENCOUNTER — Ambulatory Visit (INDEPENDENT_AMBULATORY_CARE_PROVIDER_SITE_OTHER): Payer: Medicare Other

## 2019-09-02 ENCOUNTER — Other Ambulatory Visit: Payer: Self-pay

## 2019-09-02 ENCOUNTER — Ambulatory Visit (INDEPENDENT_AMBULATORY_CARE_PROVIDER_SITE_OTHER): Payer: Medicare Other | Admitting: Family Medicine

## 2019-09-02 ENCOUNTER — Encounter: Payer: Self-pay | Admitting: Family Medicine

## 2019-09-02 VITALS — BP 150/90 | HR 79 | Ht 61.0 in | Wt 153.0 lb

## 2019-09-02 DIAGNOSIS — S73192A Other sprain of left hip, initial encounter: Secondary | ICD-10-CM

## 2019-09-02 DIAGNOSIS — M25552 Pain in left hip: Secondary | ICD-10-CM | POA: Insufficient documentation

## 2019-09-02 DIAGNOSIS — S73199A Other sprain of unspecified hip, initial encounter: Secondary | ICD-10-CM | POA: Insufficient documentation

## 2019-09-02 NOTE — Progress Notes (Signed)
[facility] [address] [place] Phone: [phone] Subjective:   I Shelley Small am serving as a Education administrator for Dr. Hulan Saas.  This visit occurred during the SARS-CoV-2 public health emergency.  Safety protocols were in place, including screening questions prior to the visit, additional usage of staff PPE, and extensive cleaning of exam room while observing appropriate contact time as indicated for disinfecting solutions.    CC: Left hip pain  [id number]  Shelley Small is a 74 y.o. female coming in with complaint of left hip pain. Patient states her his pain is chronic. Going down stairs is most painful sitting down makes it feel better. First step of the morning is painful. No prior injury. Has had an MRI that was normal. States she has to "push the hip in" in order to walk. Believes she has piriformis syndrome. Has done exercises for the pain. Proximal hamstring. Pain scale  10/10 at its worse. Pain feels deep and states that her groin area cramps when her pain is at its worse.        Social History   Socioeconomic History  . Marital status: Widowed    Spouse name: Not on file  . Number of children: Not on file  . Years of education: Not on file  . Highest education level: Not on file  Occupational History  . Not on file  Tobacco Use  . Smoking status: Not on file  Substance and Sexual Activity  . Alcohol use: Not on file  . Drug use: Not on file  . Sexual activity: Not on file  Other Topics Concern  . Not on file  Social History Narrative  . Not on file   Social Determinants of Health   Financial Resource Strain:   . Difficulty of Paying Living Expenses: Not on file  Food Insecurity:   . Worried About Charity fundraiser in the Last Year: Not on file  . Ran Out of Food in the Last Year: Not on file  Transportation Needs:   . Lack of Transportation (Medical): Not on file  . Lack of Transportation (Non-Medical):  Not on file  Physical Activity:   . Days of Exercise per Week: Not on file  . Minutes of Exercise per Session: Not on file  Stress:   . Feeling of Stress : Not on file  Social Connections:   . Frequency of Communication with Friends and Family: Not on file  . Frequency of Social Gatherings with Friends and Family: Not on file  . Attends Religious Services: Not on file  . Active Member of Clubs or Organizations: Not on file  . Attends Archivist Meetings: Not on file  . Marital Status: Not on file   Not on File No family history on file. No current outpatient medications on file.    Past medical history, social, surgical and family history all reviewed in electronic medical record.  No pertanent information unless stated regarding to the chief complaint.   Review of Systems:  No headache, visual changes, nausea, vomiting, diarrhea, constipation, dizziness, abdominal pain, skin rash, fevers, chills, night sweats, weight loss, swollen lymph nodes, body aches, joint swelling, chest pain, shortness of breath, mood changes. POSITIVE muscle aches  Objective  Blood pressure (!) 150/90, pulse 79, height 5\' 1"  (1.549 m), weight 153 lb (69.4 kg), SpO2 98 %.   General: No apparent distress alert and oriented x3 mood and affect normal, dressed appropriately.  Mild  masked facies HEENT: Pupils equal, extraocular movements intact  Respiratory: Patient's speak in full sentences and does not appear short of breath  Cardiovascular: No lower extremity edema, non tender, no erythema  Skin: Warm dry intact with no signs of infection or rash on extremities or on axial skeleton.  Abdomen: Soft nontender  Neuro: Cranial nerves II through XII are intact, neurovascularly intact in all extremities with 2+ DTRs and 2+ pulses.  Lymph: No lymphadenopathy of posterior or anterior cervical chain or axillae bilaterally.  Gait antalgic MSK: Mild arthritic changes of multiple joints  Left hip exam shows  the patient is nontender to palpation over the proximal hip joint.  Patient has negative straight leg test.  Mild pain in with internal range of motion. 4 out of 5 strength of the hip flexion compared to the contralateral side.  [id number]; 15 additional minutes spent for Therapeutic exercises as stated in above notes.  This included exercises focusing on stretching, strengthening, with significant focus on eccentric aspects.   Long term goals include an improvement in range of motion, strength, endurance as well as avoiding reinjury. Patient's frequency would include in 1-2 times a day, 3-5 times a week for a duration of 6-12 weeks. Hip strengthening exercises which included:  Pelvic tilt/bracing to help with proper recruitment of the lower abs and pelvic floor muscles  Glute strengthening to properly contract glutes without over-engaging low back and hamstrings - prone hip extension and glute bridge exercises Proper stretching techniques to increase effectiveness for the hip flexors, groin, quads, piriformic and low back when appropriate   Proper technique shown and discussed handout in great detail with ATC.  All questions were discussed and answered.   Total time spent with patient today 55 minutes which includes review of external imaging and notes.   Impression and Recommendations:     This case required medical decision making of moderate complexity. The above documentation has been reviewed and is accurate and complete Shelley Pulley, DO       Note: This dictation was prepared with Dragon dictation along with smaller phrase technology. Any transcriptional errors that result from this process are unintentional.

## 2019-09-02 NOTE — Assessment & Plan Note (Signed)
New problem.  Left hand pain.  Patient did have an MRI that was independently visualized by me from an external source.  Patient brought in the CD.  Patient did have a degenerative labral tear noted anteriorly that can go posteriorly.  Mild to moderate arthritic changes of the joint space.  This will be a chronic issue.  We discussed with patient about different treatment options.  Patient wants to avoid formal physical therapy at the moment.  We discussed icing regimen, home exercises, which activities to doing which wants to avoid.  Patient is to increase activity slowly over the course the next several weeks.  Discussed over-the-counter medications.  Worsening symptoms will consider injection.

## 2019-09-02 NOTE — Patient Instructions (Signed)
Good to see you Exercise 3 times a week Thigh compression sleeve with a lot of walking Tart cherry extract 1200mg  at night Vitamin D 4000 IU daily  See me again in 4-6 weeks

## 2019-09-13 DIAGNOSIS — I10 Essential (primary) hypertension: Secondary | ICD-10-CM | POA: Diagnosis not present

## 2019-09-13 DIAGNOSIS — D751 Secondary polycythemia: Secondary | ICD-10-CM | POA: Diagnosis not present

## 2019-09-13 DIAGNOSIS — R42 Dizziness and giddiness: Secondary | ICD-10-CM | POA: Diagnosis not present

## 2019-09-13 DIAGNOSIS — Z79899 Other long term (current) drug therapy: Secondary | ICD-10-CM | POA: Diagnosis not present

## 2019-09-13 DIAGNOSIS — L659 Nonscarring hair loss, unspecified: Secondary | ICD-10-CM | POA: Diagnosis not present

## 2019-10-03 ENCOUNTER — Ambulatory Visit (INDEPENDENT_AMBULATORY_CARE_PROVIDER_SITE_OTHER): Payer: Medicare Other

## 2019-10-03 ENCOUNTER — Other Ambulatory Visit: Payer: Self-pay

## 2019-10-03 ENCOUNTER — Encounter: Payer: Self-pay | Admitting: Family Medicine

## 2019-10-03 ENCOUNTER — Ambulatory Visit (INDEPENDENT_AMBULATORY_CARE_PROVIDER_SITE_OTHER): Payer: Medicare Other | Admitting: Family Medicine

## 2019-10-03 VITALS — BP 142/82 | HR 120 | Ht 61.0 in | Wt 153.0 lb

## 2019-10-03 DIAGNOSIS — S76312A Strain of muscle, fascia and tendon of the posterior muscle group at thigh level, left thigh, initial encounter: Secondary | ICD-10-CM

## 2019-10-03 DIAGNOSIS — S73192D Other sprain of left hip, subsequent encounter: Secondary | ICD-10-CM | POA: Diagnosis not present

## 2019-10-03 NOTE — Patient Instructions (Addendum)
Injected piriformis Listen to your body See me again in 8 weeks

## 2019-10-03 NOTE — Assessment & Plan Note (Signed)
Injected today. Hoping causing some of the pain. Labral tear and can consider injection if not better at follow up. Would like patient to consider PT  RTC in 4-6 weeks

## 2019-10-03 NOTE — Progress Notes (Signed)
[facility] [place] Phone: [phone] Subjective:   Fontaine No, am serving as a scribe for Dr. Hulan Saas. This visit occurred during the SARS-CoV-2 public health emergency.  Safety protocols were in place, including screening questions prior to the visit, additional usage of staff PPE, and extensive cleaning of exam room while observing appropriate contact time as indicated for disinfecting solutions.   I CC: Left hip pain follow-up  [id number]   09/02/2019 New problem.  Left hand pain.  Patient did have an MRI that was independently visualized by me from an external source.  Patient brought in the CD.  Patient did have a degenerative labral tear noted anteriorly that can go posteriorly.  Mild to moderate arthritic changes of the joint space.  This will be a chronic issue.  We discussed with patient about different treatment options.  Patient wants to avoid formal physical therapy at the moment.  We discussed icing regimen, home exercises, which activities to doing which wants to avoid.  Patient is to increase activity slowly over the course the next several weeks.  Discussed over-the-counter medications.  Worsening symptoms will consider injection.  Update 10/03/2019 Jeaneth Reckling is a 74 y.o. female coming in with complaint of left hip pain. Patient states that the exercises exacerbated her pain. States that she is unable to turn to left without her hip "popping out." States that she is improving since discontinuing exercises. No pain during exercises but pain occurred afterwards.  Patient states today" fortunately continues to worsen at the moment. Patient has been states that is 100% better sometimes.  Patient is not the best historian      No past medical history on file. No past surgical history on file. Social History   Socioeconomic History  . Marital status: Widowed    Spouse name: Not on file  . Number  of children: Not on file  . Years of education: Not on file  . Highest education level: Not on file  Occupational History  . Not on file  Tobacco Use  . Smoking status: Not on file  Substance and Sexual Activity  . Alcohol use: Not on file  . Drug use: Not on file  . Sexual activity: Not on file  Other Topics Concern  . Not on file  Social History Narrative  . Not on file   Social Determinants of Health   Financial Resource Strain:   . Difficulty of Paying Living Expenses: Not on file  Food Insecurity:   . Worried About Charity fundraiser in the Last Year: Not on file  . Ran Out of Food in the Last Year: Not on file  Transportation Needs:   . Lack of Transportation (Medical): Not on file  . Lack of Transportation (Non-Medical): Not on file  Physical Activity:   . Days of Exercise per Week: Not on file  . Minutes of Exercise per Session: Not on file  Stress:   . Feeling of Stress : Not on file  Social Connections:   . Frequency of Communication with Friends and Family: Not on file  . Frequency of Social Gatherings with Friends and Family: Not on file  . Attends Religious Services: Not on file  . Active Member of Clubs or Organizations: Not on file  . Attends Archivist Meetings: Not on file  . Marital Status: Not on file   Not on File no known drug allergies No family history on  file.  No family history of autoimmune No current outpatient medications on file.   Reviewed prior external information including notes and imaging from  primary care provider As well as notes that were available from care everywhere and other healthcare systems.  Past medical history, social, surgical and family history all reviewed in electronic medical record.  No pertanent information unless stated regarding to the chief complaint.   Review of Systems:  No headache, visual changes, nausea, vomiting, diarrhea, constipation, dizziness, abdominal pain, skin rash, fevers, chills,  night sweats, weight loss, swollen lymph nodes, body aches, joint swelling, chest pain, shortness of breath, mood changes. POSITIVE muscle aches  Objective  Blood pressure (!) 142/82, pulse (!) 120, height 5\' 1"  (1.549 m), weight 153 lb (69.4 kg), SpO2 97 %.   General: No apparent distress alert and oriented x3 mood and affect normal, dressed appropriately.  HEENT: Pupils equal, extraocular movements intact  Respiratory: Patient's speak in full sentences and does not appear short of breath  Cardiovascular: No lower extremity edema, non tender, no erythema  Skin: Warm dry intact with no signs of infection or rash on extremities or on axial skeleton.  Abdomen: Soft nontender  Neuro: Cranial nerves II through XII are intact, neurovascularly intact in all extremities with 2+ DTRs and 2+ pulses.  Lymph: No lymphadenopathy of posterior or anterior cervical chain or axillae bilaterally.  Gait antalgic MSK: Left hip still has decreased range of motion with internal range of motion but pain seems to be more on the posterior aspect of the hip this time.  Seems to be more over the piriformis.  Positive FABER test that seems to be worse than previous exam.  Procedure: Real-time Ultrasound Guided Injection of left piriformis tendon sheath Device: GE Logiq Q7 Ultrasound guided injection is preferred based studies that show increased duration, increased effect, greater accuracy, decreased procedural pain, increased response rate, and decreased cost with ultrasound guided versus blind injection.  Verbal informed consent obtained.  Time-out conducted.  Noted no overlying erythema, induration, or other signs of local infection.  Skin prepped in a sterile fashion.  Local anesthesia: Topical Ethyl chloride.  With sterile technique and under real time ultrasound guidance: With a 21-gauge 2 inch needle injected into the left piriformis tendon with a total of 0.5 cc of 0.5% Marcaine and 0.5 cc of Kenalog 40  mg/mL Completed without difficulty  Pain immediately resolved suggesting accurate placement of the medication.  Advised to call if fevers/chills, erythema, induration, drainage, or persistent bleeding.  Images permanently stored and available for review in the ultrasound unit.  Impression: Technically successful ultrasound guided injection.   Impression and Recommendations:     This case required medical decision making of moderate complexity. The above documentation has been reviewed and is accurate and complete Lyndal Pulley, DO       Note: This dictation was prepared with Dragon dictation along with smaller phrase technology. Any transcriptional errors that result from this process are unintentional.

## 2019-10-03 NOTE — Assessment & Plan Note (Signed)
If new improvement can consider intra-articular injection.

## 2019-10-08 DIAGNOSIS — R5383 Other fatigue: Secondary | ICD-10-CM | POA: Diagnosis not present

## 2019-10-08 DIAGNOSIS — Z0000 Encounter for general adult medical examination without abnormal findings: Secondary | ICD-10-CM | POA: Diagnosis not present

## 2019-10-08 DIAGNOSIS — E1169 Type 2 diabetes mellitus with other specified complication: Secondary | ICD-10-CM | POA: Diagnosis not present

## 2019-10-08 DIAGNOSIS — Z01419 Encounter for gynecological examination (general) (routine) without abnormal findings: Secondary | ICD-10-CM | POA: Diagnosis not present

## 2019-10-08 DIAGNOSIS — D751 Secondary polycythemia: Secondary | ICD-10-CM | POA: Diagnosis not present

## 2019-10-08 DIAGNOSIS — E7800 Pure hypercholesterolemia, unspecified: Secondary | ICD-10-CM | POA: Diagnosis not present

## 2019-10-08 DIAGNOSIS — I10 Essential (primary) hypertension: Secondary | ICD-10-CM | POA: Diagnosis not present

## 2019-10-08 DIAGNOSIS — Z91038 Other insect allergy status: Secondary | ICD-10-CM | POA: Diagnosis not present

## 2019-10-16 DIAGNOSIS — Z1211 Encounter for screening for malignant neoplasm of colon: Secondary | ICD-10-CM | POA: Diagnosis not present

## 2019-11-14 DIAGNOSIS — Z23 Encounter for immunization: Secondary | ICD-10-CM | POA: Diagnosis not present

## 2019-11-27 NOTE — Progress Notes (Signed)
[place] Phone: [phone] Subjective:   Fontaine No, am serving as a scribe for Dr. Hulan Saas. This visit occurred during the SARS-CoV-2 public health emergency.  Safety protocols were in place, including screening questions prior to the visit, additional usage of staff PPE, and extensive cleaning of exam room while observing appropriate contact time as indicated for disinfecting solutions.   I'm seeing this patient by the request  of:  Finis Bud, MD  CC: Knee pain follow-up  [id number]   10/03/2019 Injected today. Hoping causing some of the pain. Labral tear and can consider injection if not better at follow up. Would like patient to consider PT  RTC in 4-6 weeks   Update 11/28/2019  Shelley Small is a 74 y.o. female coming in with complaint of left hip pain. Patient states that pain improved following injection last visit.  Improvement in range of motion.  Patient is having less tenderness than previous exam.  Also notes that when she lies on her back her right leg will go numb. States that this issue is chronic. Does not have low back pain throughout the day.  Patient states that initially 3 years ago had an injury when seen a chiropractor.  Then did physical therapy and did make improvement.  States that 2 days ago had unrelenting severe pain on the right side and then today no pain at all.       No past medical history on file. No past surgical history on file. Social History   Socioeconomic History  . Marital status: Widowed    Spouse name: Not on file  . Number of children: Not on file  . Years of education: Not on file  . Highest education level: Not on file  Occupational History  . Not on file  Tobacco Use  . Smoking status: Not on file  Substance and Sexual Activity  . Alcohol use: Not on file  . Drug use: Not on file  . Sexual activity: Not on file  Other Topics  Concern  . Not on file  Social History Narrative  . Not on file   Social Determinants of Health   Financial Resource Strain:   . Difficulty of Paying Living Expenses:   Food Insecurity:   . Worried About Charity fundraiser in the Last Year:   . Arboriculturist in the Last Year:   Transportation Needs:   . Film/video editor (Medical):   Marland Kitchen Lack of Transportation (Non-Medical):   Physical Activity:   . Days of Exercise per Week:   . Minutes of Exercise per Session:   Stress:   . Feeling of Stress :   Social Connections:   . Frequency of Communication with Friends and Family:   . Frequency of Social Gatherings with Friends and Family:   . Attends Religious Services:   . Active Member of Clubs or Organizations:   . Attends Archivist Meetings:   Marland Kitchen Marital Status:    Not on File No family history on file. No current outpatient medications on file.   Reviewed prior external information including notes and imaging from  primary care provider As well as notes that were available from care everywhere and other healthcare systems.  Past medical history, social, surgical and family history all reviewed in electronic medical record.  No pertanent information unless stated regarding to the chief complaint.   Review of Systems:  No headache, visual changes, nausea, vomiting, diarrhea, constipation, dizziness, abdominal pain, skin rash, fevers, chills, night sweats, weight loss, swollen lymph nodes, body aches, joint swelling, chest pain, shortness of breath, mood changes. POSITIVE muscle aches  Objective  Blood pressure 122/66, pulse 88, height 5\' 1"  (1.549 m), weight 153 lb (69.4 kg), SpO2 96 %.   General: No apparent distress alert and oriented x2 mood and affect normal, dressed appropriately.  HEENT: Pupils equal, extraocular movements intact  Respiratory: Patient's speak in full sentences and does not appear short of breath  Cardiovascular: lower extremity edema,  non tender, no erythema  Neuro: Cranial nerves II through XII are intact, neurovascularly intact in all extremities with 2+ DTRs and 2+ pulses.  Gait very mild antalgic MSK: Arthritic changes of multiple joints Patient's right leg he does have mild tightness of the hamstring but negative straight leg test.  Mild tenderness to palpation in the paraspinal musculature lumbar spine.  Patient's left hip significantly improved with range of motion at the moment.  No pain on the greater trochanteric area.    Impression and Recommendations:      The above documentation has been reviewed and is accurate and complete Lyndal Pulley, DO       Note: This dictation was prepared with Dragon dictation along with smaller phrase technology. Any transcriptional errors that result from this process are unintentional.

## 2019-11-28 ENCOUNTER — Encounter: Payer: Self-pay | Admitting: Family Medicine

## 2019-11-28 ENCOUNTER — Ambulatory Visit (INDEPENDENT_AMBULATORY_CARE_PROVIDER_SITE_OTHER): Payer: Medicare Other

## 2019-11-28 ENCOUNTER — Ambulatory Visit (INDEPENDENT_AMBULATORY_CARE_PROVIDER_SITE_OTHER): Payer: Medicare Other | Admitting: Family Medicine

## 2019-11-28 ENCOUNTER — Other Ambulatory Visit: Payer: Self-pay

## 2019-11-28 VITALS — BP 122/66 | HR 88 | Ht 61.0 in | Wt 153.0 lb

## 2019-11-28 DIAGNOSIS — M5441 Lumbago with sciatica, right side: Secondary | ICD-10-CM | POA: Diagnosis not present

## 2019-11-28 DIAGNOSIS — G8929 Other chronic pain: Secondary | ICD-10-CM | POA: Diagnosis not present

## 2019-11-28 DIAGNOSIS — M545 Low back pain, unspecified: Secondary | ICD-10-CM

## 2019-11-28 DIAGNOSIS — M160 Bilateral primary osteoarthritis of hip: Secondary | ICD-10-CM | POA: Diagnosis not present

## 2019-11-28 DIAGNOSIS — M5137 Other intervertebral disc degeneration, lumbosacral region: Secondary | ICD-10-CM | POA: Diagnosis not present

## 2019-11-28 NOTE — Patient Instructions (Signed)
Good to see you.   pennsaid pinkie amount topically 2 times daily as needed.   See me again in 3 months

## 2019-11-28 NOTE — Assessment & Plan Note (Signed)
Right-sided low back pain with more this appears to be more independent in the interval radicular symptoms.  Patient has no weakness noted today.  Does have arthritic changes of the back but x-rays ordered today.  Patient is in agreement with trying some exercises but declined formal physical therapy today.  We discussed such medications such as gabapentin which patient also declined.  Talked to her for great length and we can try to encourage her to do some of these other exercises but patient was quite adamant against them at the moment.  Patient will follow up with me again in 2 to 3 months.  Total time with patient today 33 minutes.  This includes everything we discussed different treatment options and review of patient's chart.

## 2019-12-12 DIAGNOSIS — D751 Secondary polycythemia: Secondary | ICD-10-CM | POA: Diagnosis not present

## 2019-12-23 ENCOUNTER — Telehealth: Payer: Self-pay | Admitting: Family Medicine

## 2019-12-23 NOTE — Telephone Encounter (Signed)
Left message for patient to call back to inform her of xray results.

## 2019-12-23 NOTE — Telephone Encounter (Signed)
Mild arthritis in both hips. Severe arthritic changes at L5-S1 of the lumbar spine.  Continue to plan for now

## 2019-12-23 NOTE — Telephone Encounter (Signed)
Patient called to follow up on her xray results. Please advise.

## 2019-12-23 NOTE — Telephone Encounter (Signed)
Spoke with patient per results.  

## 2020-02-12 DIAGNOSIS — Z1231 Encounter for screening mammogram for malignant neoplasm of breast: Secondary | ICD-10-CM | POA: Diagnosis not present

## 2020-02-25 DIAGNOSIS — R350 Frequency of micturition: Secondary | ICD-10-CM | POA: Diagnosis not present

## 2020-02-25 DIAGNOSIS — E7800 Pure hypercholesterolemia, unspecified: Secondary | ICD-10-CM | POA: Diagnosis not present

## 2020-02-25 DIAGNOSIS — Z91038 Other insect allergy status: Secondary | ICD-10-CM | POA: Diagnosis not present

## 2020-02-25 DIAGNOSIS — I10 Essential (primary) hypertension: Secondary | ICD-10-CM | POA: Diagnosis not present

## 2020-02-25 DIAGNOSIS — R300 Dysuria: Secondary | ICD-10-CM | POA: Diagnosis not present

## 2020-02-25 DIAGNOSIS — E1140 Type 2 diabetes mellitus with diabetic neuropathy, unspecified: Secondary | ICD-10-CM | POA: Diagnosis not present

## 2020-02-25 DIAGNOSIS — R7989 Other specified abnormal findings of blood chemistry: Secondary | ICD-10-CM | POA: Diagnosis not present

## 2020-02-25 DIAGNOSIS — E1169 Type 2 diabetes mellitus with other specified complication: Secondary | ICD-10-CM | POA: Diagnosis not present

## 2020-02-25 DIAGNOSIS — D751 Secondary polycythemia: Secondary | ICD-10-CM | POA: Diagnosis not present

## 2020-02-27 ENCOUNTER — Ambulatory Visit: Payer: Medicare Other | Admitting: Family Medicine

## 2020-03-05 ENCOUNTER — Other Ambulatory Visit: Payer: Self-pay

## 2020-03-05 ENCOUNTER — Encounter: Payer: Self-pay | Admitting: Family Medicine

## 2020-03-05 ENCOUNTER — Ambulatory Visit (INDEPENDENT_AMBULATORY_CARE_PROVIDER_SITE_OTHER): Payer: Medicare Other | Admitting: Family Medicine

## 2020-03-05 DIAGNOSIS — G8929 Other chronic pain: Secondary | ICD-10-CM

## 2020-03-05 DIAGNOSIS — M5441 Lumbago with sciatica, right side: Secondary | ICD-10-CM | POA: Diagnosis not present

## 2020-03-05 NOTE — Patient Instructions (Signed)
Good to see you Exercise 3 times a week See me when you need me

## 2020-03-05 NOTE — Assessment & Plan Note (Signed)
Patient seems to be doing significantly better at this time.  Patient does not need to take any medications on a regular basis.  Feels like she is doing well and wants to follow-up as needed

## 2020-03-05 NOTE — Progress Notes (Signed)
[facility] [address] [place] Phone: [phone] Subjective:   I Shelley Small am serving as a Education administrator for Dr. Hulan Saas.  This visit occurred during the SARS-CoV-2 public health emergency.  Safety protocols were in place, including screening questions prior to the visit, additional usage of staff PPE, and extensive cleaning of exam room while observing appropriate contact time as indicated for disinfecting solutions.   I'm seeing this patient by the request  of:  Finis Bud, MD  CC:   [id number]   11/28/2019 Right-sided low back pain with more this appears to be more independent in the interval radicular symptoms.  Patient has no weakness noted today.  Does have arthritic changes of the back but x-rays ordered today.  Patient is in agreement with trying some exercises but declined formal physical therapy today.  We discussed such medications such as gabapentin which patient also declined.  Talked to her for great length and we can try to encourage her to do some of these other exercises but patient was quite adamant against them at the moment.  Patient will follow up with me again in 2 to 3 months.  Total time with patient today 33 minutes.  This includes everything we discussed different treatment options and review of patient's chart.  Update 03/05/2020 Shelley Small is a 74 y.o. female coming in with complaint of right sided low back pain. Patient states her hip is doing better.  Patient states that overall not much pain at all.  When it does occur she does have to sit for a little bit and then it goes away.  Able to do daily activities and sleeping comfortably.  Not taking anything for pain     No past medical history on file. No past surgical history on file. Social History   Socioeconomic History  . Marital status: Widowed    Spouse name: Not on file  . Number of children: Not on file  . Years of education: Not  on file  . Highest education level: Not on file  Occupational History  . Not on file  Tobacco Use  . Smoking status: Not on file  Substance and Sexual Activity  . Alcohol use: Not on file  . Drug use: Not on file  . Sexual activity: Not on file  Other Topics Concern  . Not on file  Social History Narrative  . Not on file   Social Determinants of Health   Financial Resource Strain:   . Difficulty of Paying Living Expenses:   Food Insecurity:   . Worried About Charity fundraiser in the Last Year:   . Arboriculturist in the Last Year:   Transportation Needs:   . Film/video editor (Medical):   Marland Kitchen Lack of Transportation (Non-Medical):   Physical Activity:   . Days of Exercise per Week:   . Minutes of Exercise per Session:   Stress:   . Feeling of Stress :   Social Connections:   . Frequency of Communication with Friends and Family:   . Frequency of Social Gatherings with Friends and Family:   . Attends Religious Services:   . Active Member of Clubs or Organizations:   . Attends Archivist Meetings:   Marland Kitchen Marital Status:    Not on File No family history on file. No current outpatient medications on file.   Reviewed prior external information including notes and imaging from  primary care provider  As well as notes that were available from care everywhere and other healthcare systems.  Past medical history, social, surgical and family history all reviewed in electronic medical record.  No pertanent information unless stated regarding to the chief complaint.   Review of Systems:  No headache, visual changes, nausea, vomiting, diarrhea, constipation, dizziness, abdominal pain, skin rash, fevers, chills, night sweats, weight loss, swollen lymph nodes, body aches, joint swelling, chest pain, shortness of breath, mood changes. POSITIVE muscle aches  Objective  Blood pressure 110/70, pulse 78, height 5\' 1"  (1.549 m), weight 148 lb (67.1 kg), SpO2 97 %.   General:  No apparent distress alert and oriented x3 mood and affect normal, dressed appropriately.  HEENT: Pupils equal, extraocular movements intact  Respiratory: Patient's speak in full sentences and does not appear short of breath  Cardiovascular: No lower extremity edema, non tender, no erythema  Neuro: Cranial nerves II through XII are intact, neurovascularly intact in all extremities with 2+ DTRs and 2+ pulses.  Gait normal with good balance and coordination.  MSK: Arthritic changes.  Minimal discomfort with the paraspinal musculature.  Patient is having minimal Low back exam does show some loss of lordosis.  Tender to palpation in the paraspinal musculature lumbar spine left greater than right.  Otherwise fairly unremarkable   Impression and Recommendations:     The above documentation has been reviewed and is accurate and complete Shelley Pulley, DO       Note: This dictation was prepared with Dragon dictation along with smaller phrase technology. Any transcriptional errors that result from this process are unintentional.

## 2020-03-17 DIAGNOSIS — L659 Nonscarring hair loss, unspecified: Secondary | ICD-10-CM | POA: Diagnosis not present

## 2020-03-17 DIAGNOSIS — Z7982 Long term (current) use of aspirin: Secondary | ICD-10-CM | POA: Diagnosis not present

## 2020-03-17 DIAGNOSIS — Z85828 Personal history of other malignant neoplasm of skin: Secondary | ICD-10-CM | POA: Diagnosis not present

## 2020-03-17 DIAGNOSIS — Z86718 Personal history of other venous thrombosis and embolism: Secondary | ICD-10-CM | POA: Diagnosis not present

## 2020-03-17 DIAGNOSIS — Z79899 Other long term (current) drug therapy: Secondary | ICD-10-CM | POA: Diagnosis not present

## 2020-03-17 DIAGNOSIS — Z7984 Long term (current) use of oral hypoglycemic drugs: Secondary | ICD-10-CM | POA: Diagnosis not present

## 2020-03-17 DIAGNOSIS — R42 Dizziness and giddiness: Secondary | ICD-10-CM | POA: Diagnosis not present

## 2020-03-17 DIAGNOSIS — I10 Essential (primary) hypertension: Secondary | ICD-10-CM | POA: Diagnosis not present

## 2020-03-17 DIAGNOSIS — D751 Secondary polycythemia: Secondary | ICD-10-CM | POA: Diagnosis not present

## 2020-06-17 DIAGNOSIS — D751 Secondary polycythemia: Secondary | ICD-10-CM | POA: Diagnosis not present

## 2020-06-22 DIAGNOSIS — E1169 Type 2 diabetes mellitus with other specified complication: Secondary | ICD-10-CM | POA: Diagnosis not present

## 2020-06-22 DIAGNOSIS — E7800 Pure hypercholesterolemia, unspecified: Secondary | ICD-10-CM | POA: Diagnosis not present

## 2020-06-22 DIAGNOSIS — Z91030 Bee allergy status: Secondary | ICD-10-CM | POA: Diagnosis not present

## 2020-06-22 DIAGNOSIS — E1140 Type 2 diabetes mellitus with diabetic neuropathy, unspecified: Secondary | ICD-10-CM | POA: Diagnosis not present

## 2020-06-22 DIAGNOSIS — I10 Essential (primary) hypertension: Secondary | ICD-10-CM | POA: Diagnosis not present

## 2020-07-09 DIAGNOSIS — H43813 Vitreous degeneration, bilateral: Secondary | ICD-10-CM | POA: Diagnosis not present

## 2020-07-09 DIAGNOSIS — H33311 Horseshoe tear of retina without detachment, right eye: Secondary | ICD-10-CM | POA: Diagnosis not present

## 2020-07-09 DIAGNOSIS — Z961 Presence of intraocular lens: Secondary | ICD-10-CM | POA: Diagnosis not present

## 2020-09-01 DIAGNOSIS — Z961 Presence of intraocular lens: Secondary | ICD-10-CM | POA: Diagnosis not present

## 2020-09-01 DIAGNOSIS — H43813 Vitreous degeneration, bilateral: Secondary | ICD-10-CM | POA: Diagnosis not present

## 2020-09-01 DIAGNOSIS — H33311 Horseshoe tear of retina without detachment, right eye: Secondary | ICD-10-CM | POA: Diagnosis not present

## 2020-09-01 DIAGNOSIS — H524 Presbyopia: Secondary | ICD-10-CM | POA: Diagnosis not present

## 2020-09-01 DIAGNOSIS — H52221 Regular astigmatism, right eye: Secondary | ICD-10-CM | POA: Diagnosis not present

## 2020-09-08 IMAGING — DX DG LUMBAR SPINE 2-3V
3 series · 3 of 3 positions shown · non-contrast
Comparison: None.

CLINICAL DATA: Chronic low back pain.

EXAM:
LUMBAR SPINE - 2-3 VIEW

[lumbar spine ap]
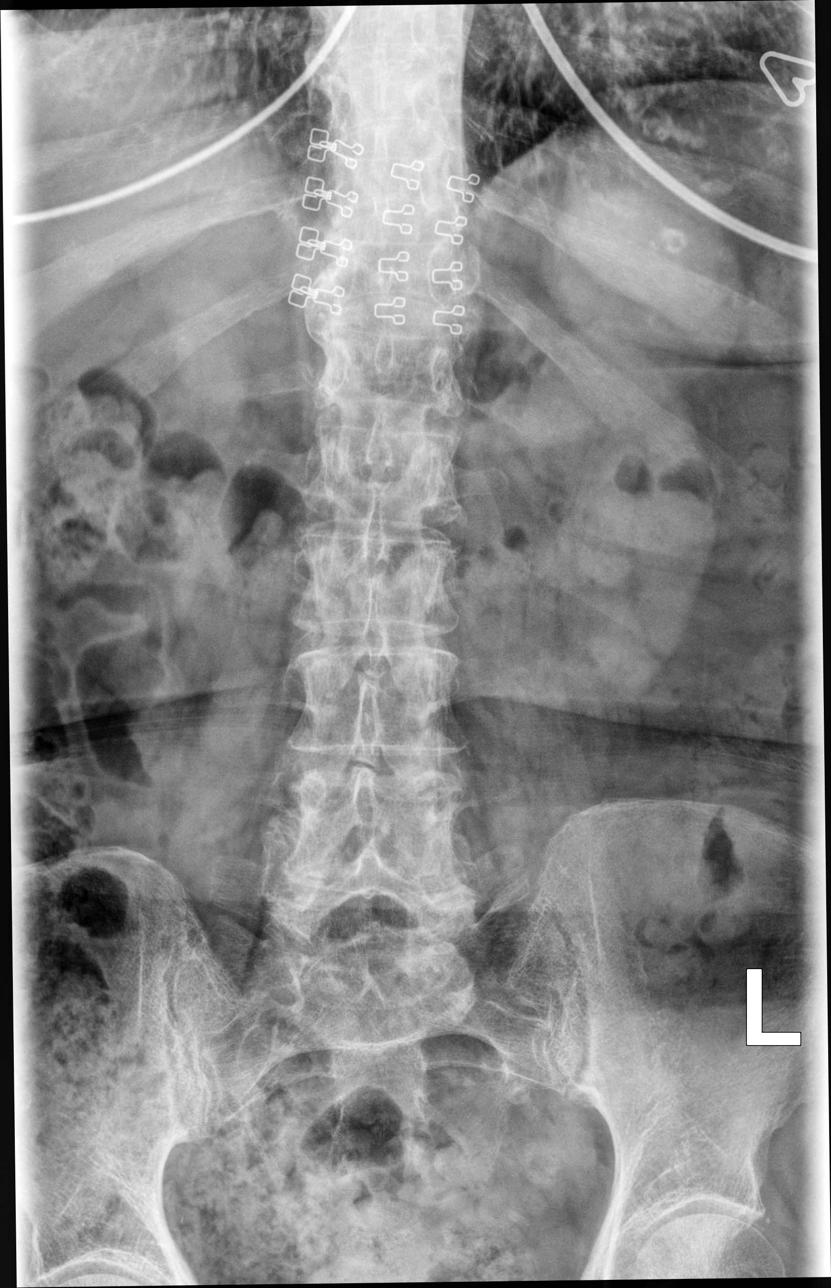

[lumbar spine lat (1 of 2)]
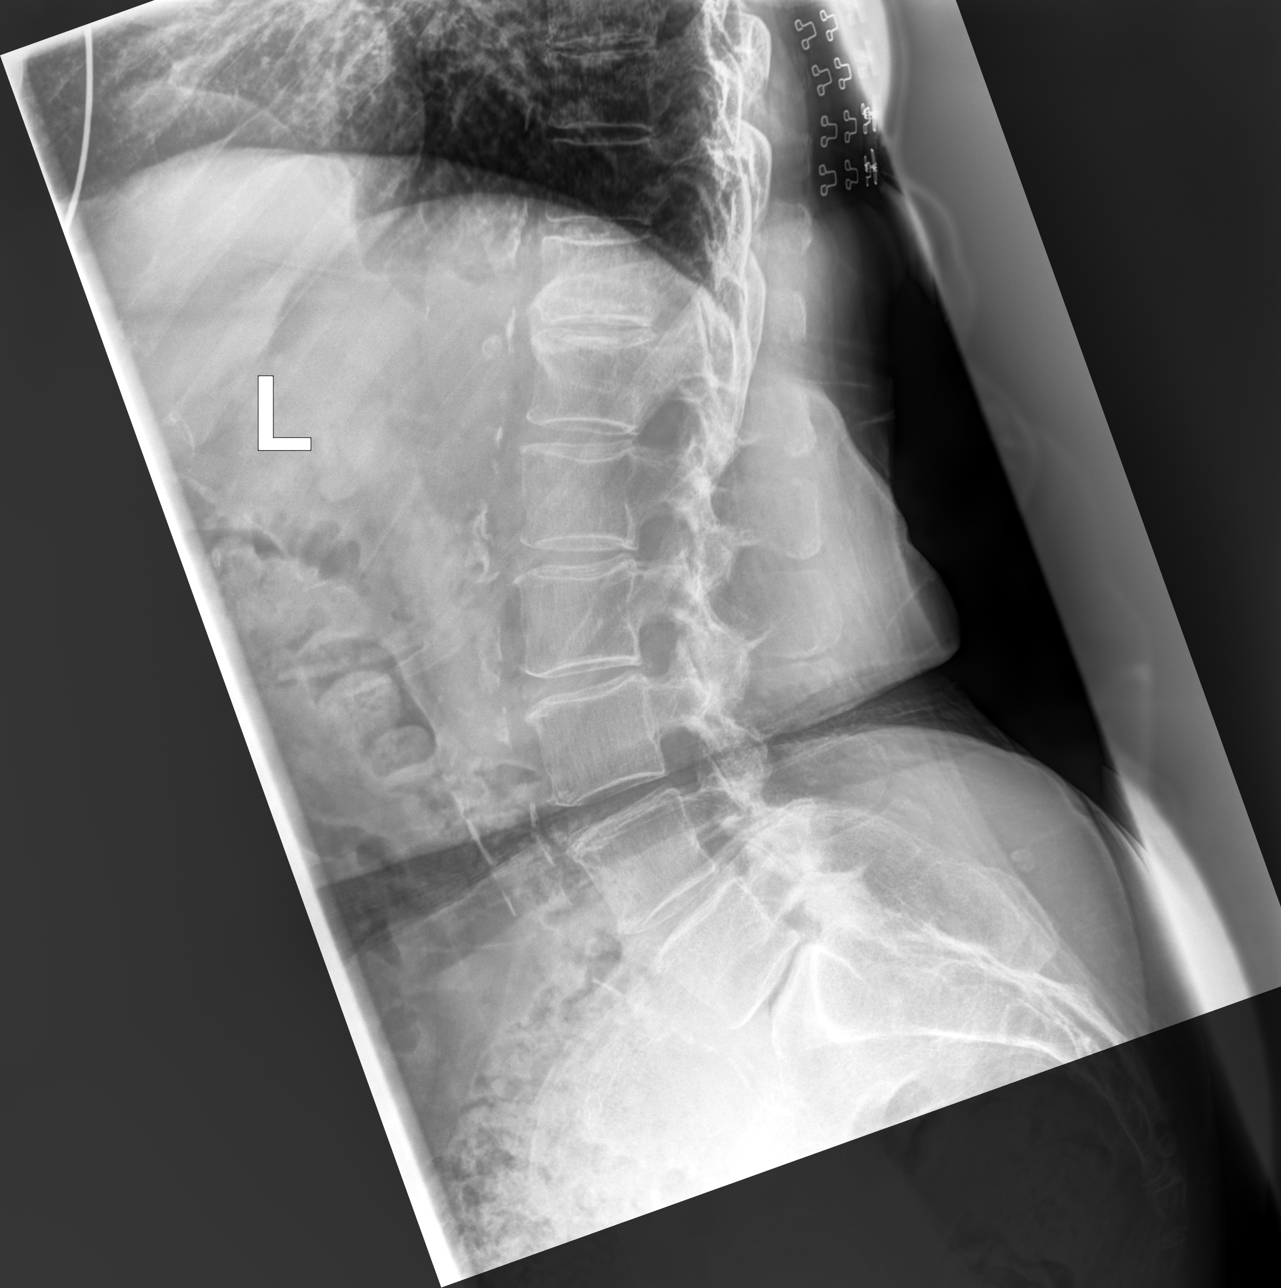

[lumbar spine lat (2 of 2)]
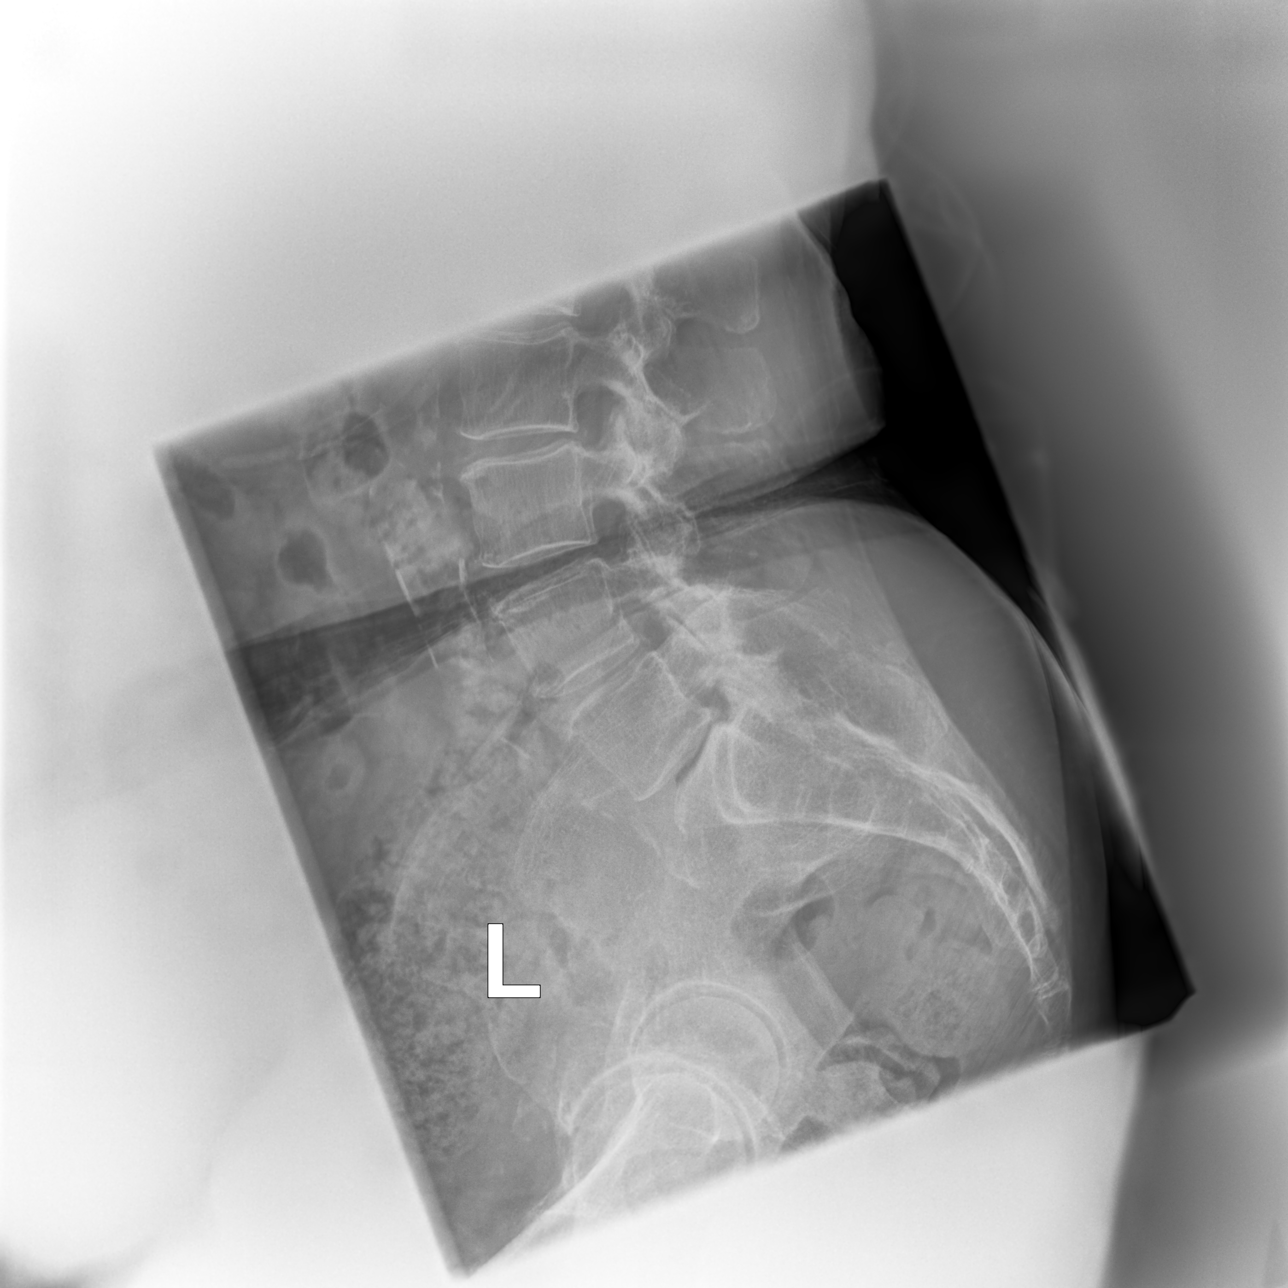

[3 of 3 positions shown; findings below may reference images not displayed]

FINDINGS: Five lumbar type vertebra. Alignment is normal. Marked disc space
narrowing at L5-S1 with a vacuum phenomenon. Bilateral facet
arthritis at L4-5.

Aortic atherosclerosis.
IMPRESSION: 1. Degenerative disc disease at L5-S1 with a vacuum phenomenon.
2. Bilateral facet arthritis at L4-5.
3.  Aortic Atherosclerosis ([id number]).

## 2020-09-18 DIAGNOSIS — Z85828 Personal history of other malignant neoplasm of skin: Secondary | ICD-10-CM | POA: Diagnosis not present

## 2020-09-18 DIAGNOSIS — Z7982 Long term (current) use of aspirin: Secondary | ICD-10-CM | POA: Diagnosis not present

## 2020-09-18 DIAGNOSIS — Z9181 History of falling: Secondary | ICD-10-CM | POA: Diagnosis not present

## 2020-09-18 DIAGNOSIS — Z86718 Personal history of other venous thrombosis and embolism: Secondary | ICD-10-CM | POA: Diagnosis not present

## 2020-09-18 DIAGNOSIS — Z7984 Long term (current) use of oral hypoglycemic drugs: Secondary | ICD-10-CM | POA: Diagnosis not present

## 2020-09-18 DIAGNOSIS — Z880 Allergy status to penicillin: Secondary | ICD-10-CM | POA: Diagnosis not present

## 2020-09-18 DIAGNOSIS — I10 Essential (primary) hypertension: Secondary | ICD-10-CM | POA: Diagnosis not present

## 2020-09-18 DIAGNOSIS — Z7189 Other specified counseling: Secondary | ICD-10-CM | POA: Diagnosis not present

## 2020-09-18 DIAGNOSIS — Z79899 Other long term (current) drug therapy: Secondary | ICD-10-CM | POA: Diagnosis not present

## 2020-09-18 DIAGNOSIS — Z7185 Encounter for immunization safety counseling: Secondary | ICD-10-CM | POA: Diagnosis not present

## 2020-09-18 DIAGNOSIS — D751 Secondary polycythemia: Secondary | ICD-10-CM | POA: Diagnosis not present

## 2020-10-13 DIAGNOSIS — Z0000 Encounter for general adult medical examination without abnormal findings: Secondary | ICD-10-CM | POA: Diagnosis not present

## 2020-10-13 DIAGNOSIS — I10 Essential (primary) hypertension: Secondary | ICD-10-CM | POA: Diagnosis not present

## 2020-10-13 DIAGNOSIS — Z01419 Encounter for gynecological examination (general) (routine) without abnormal findings: Secondary | ICD-10-CM | POA: Diagnosis not present

## 2020-10-13 DIAGNOSIS — Z91030 Bee allergy status: Secondary | ICD-10-CM | POA: Diagnosis not present

## 2020-10-13 DIAGNOSIS — R5383 Other fatigue: Secondary | ICD-10-CM | POA: Diagnosis not present

## 2020-10-13 DIAGNOSIS — E1169 Type 2 diabetes mellitus with other specified complication: Secondary | ICD-10-CM | POA: Diagnosis not present

## 2020-10-13 DIAGNOSIS — E7800 Pure hypercholesterolemia, unspecified: Secondary | ICD-10-CM | POA: Diagnosis not present

## 2020-11-17 DIAGNOSIS — E1169 Type 2 diabetes mellitus with other specified complication: Secondary | ICD-10-CM | POA: Diagnosis not present

## 2020-11-17 DIAGNOSIS — R002 Palpitations: Secondary | ICD-10-CM | POA: Diagnosis not present

## 2020-11-17 DIAGNOSIS — E7800 Pure hypercholesterolemia, unspecified: Secondary | ICD-10-CM | POA: Diagnosis not present

## 2020-11-17 DIAGNOSIS — I10 Essential (primary) hypertension: Secondary | ICD-10-CM | POA: Diagnosis not present

## 2021-02-16 DIAGNOSIS — Z20822 Contact with and (suspected) exposure to covid-19: Secondary | ICD-10-CM | POA: Diagnosis not present

## 2021-02-17 DIAGNOSIS — Z20822 Contact with and (suspected) exposure to covid-19: Secondary | ICD-10-CM | POA: Diagnosis not present

## 2021-02-19 DIAGNOSIS — Z1231 Encounter for screening mammogram for malignant neoplasm of breast: Secondary | ICD-10-CM | POA: Diagnosis not present

## 2021-02-23 DIAGNOSIS — I10 Essential (primary) hypertension: Secondary | ICD-10-CM | POA: Diagnosis not present

## 2021-02-23 DIAGNOSIS — E7800 Pure hypercholesterolemia, unspecified: Secondary | ICD-10-CM | POA: Diagnosis not present

## 2021-02-23 DIAGNOSIS — Z91030 Bee allergy status: Secondary | ICD-10-CM | POA: Diagnosis not present

## 2021-02-23 DIAGNOSIS — E1169 Type 2 diabetes mellitus with other specified complication: Secondary | ICD-10-CM | POA: Diagnosis not present

## 2021-03-02 DIAGNOSIS — D751 Secondary polycythemia: Secondary | ICD-10-CM | POA: Diagnosis not present

## 2021-03-19 DIAGNOSIS — I10 Essential (primary) hypertension: Secondary | ICD-10-CM | POA: Diagnosis not present

## 2021-03-19 DIAGNOSIS — D751 Secondary polycythemia: Secondary | ICD-10-CM | POA: Diagnosis not present

## 2021-03-19 DIAGNOSIS — E663 Overweight: Secondary | ICD-10-CM | POA: Diagnosis not present

## 2021-03-22 DIAGNOSIS — U071 COVID-19: Secondary | ICD-10-CM | POA: Diagnosis not present

## 2021-05-11 DIAGNOSIS — R002 Palpitations: Secondary | ICD-10-CM | POA: Diagnosis not present

## 2021-05-11 DIAGNOSIS — D751 Secondary polycythemia: Secondary | ICD-10-CM | POA: Diagnosis not present

## 2021-05-11 DIAGNOSIS — I10 Essential (primary) hypertension: Secondary | ICD-10-CM | POA: Diagnosis not present

## 2021-05-11 DIAGNOSIS — R079 Chest pain, unspecified: Secondary | ICD-10-CM | POA: Diagnosis not present

## 2021-05-26 DIAGNOSIS — Z20822 Contact with and (suspected) exposure to covid-19: Secondary | ICD-10-CM | POA: Diagnosis not present

## 2021-05-28 DIAGNOSIS — I10 Essential (primary) hypertension: Secondary | ICD-10-CM | POA: Diagnosis not present

## 2021-05-28 DIAGNOSIS — D751 Secondary polycythemia: Secondary | ICD-10-CM | POA: Diagnosis not present

## 2021-05-28 DIAGNOSIS — R079 Chest pain, unspecified: Secondary | ICD-10-CM | POA: Diagnosis not present

## 2021-05-28 DIAGNOSIS — R002 Palpitations: Secondary | ICD-10-CM | POA: Diagnosis not present

## 2021-06-14 DIAGNOSIS — Z20822 Contact with and (suspected) exposure to covid-19: Secondary | ICD-10-CM | POA: Diagnosis not present

## 2021-06-21 DIAGNOSIS — I10 Essential (primary) hypertension: Secondary | ICD-10-CM | POA: Diagnosis not present

## 2021-06-21 DIAGNOSIS — Z91030 Bee allergy status: Secondary | ICD-10-CM | POA: Diagnosis not present

## 2021-06-21 DIAGNOSIS — E7800 Pure hypercholesterolemia, unspecified: Secondary | ICD-10-CM | POA: Diagnosis not present

## 2021-06-21 DIAGNOSIS — E1169 Type 2 diabetes mellitus with other specified complication: Secondary | ICD-10-CM | POA: Diagnosis not present

## 2021-06-25 DIAGNOSIS — I10 Essential (primary) hypertension: Secondary | ICD-10-CM | POA: Diagnosis not present

## 2021-06-25 DIAGNOSIS — E2740 Unspecified adrenocortical insufficiency: Secondary | ICD-10-CM | POA: Diagnosis not present

## 2021-06-25 DIAGNOSIS — R002 Palpitations: Secondary | ICD-10-CM | POA: Diagnosis not present

## 2021-07-26 DIAGNOSIS — B029 Zoster without complications: Secondary | ICD-10-CM | POA: Diagnosis not present

## 2021-07-28 DIAGNOSIS — I10 Essential (primary) hypertension: Secondary | ICD-10-CM | POA: Diagnosis not present

## 2021-07-28 DIAGNOSIS — E2740 Unspecified adrenocortical insufficiency: Secondary | ICD-10-CM | POA: Diagnosis not present

## 2021-08-02 DIAGNOSIS — Z20822 Contact with and (suspected) exposure to covid-19: Secondary | ICD-10-CM | POA: Diagnosis not present

## 2021-09-02 DIAGNOSIS — Z20822 Contact with and (suspected) exposure to covid-19: Secondary | ICD-10-CM | POA: Diagnosis not present

## 2021-09-03 DIAGNOSIS — Z20822 Contact with and (suspected) exposure to covid-19: Secondary | ICD-10-CM | POA: Diagnosis not present

## 2021-09-03 DIAGNOSIS — Z1152 Encounter for screening for COVID-19: Secondary | ICD-10-CM | POA: Diagnosis not present

## 2021-09-16 DIAGNOSIS — Z20822 Contact with and (suspected) exposure to covid-19: Secondary | ICD-10-CM | POA: Diagnosis not present

## 2021-09-24 DIAGNOSIS — I10 Essential (primary) hypertension: Secondary | ICD-10-CM | POA: Diagnosis not present

## 2021-09-24 DIAGNOSIS — I471 Supraventricular tachycardia: Secondary | ICD-10-CM | POA: Diagnosis not present

## 2021-09-29 DIAGNOSIS — Z20822 Contact with and (suspected) exposure to covid-19: Secondary | ICD-10-CM | POA: Diagnosis not present

## 2021-10-01 DIAGNOSIS — Z20822 Contact with and (suspected) exposure to covid-19: Secondary | ICD-10-CM | POA: Diagnosis not present

## 2021-10-05 DIAGNOSIS — Z20822 Contact with and (suspected) exposure to covid-19: Secondary | ICD-10-CM | POA: Diagnosis not present

## 2021-10-11 DIAGNOSIS — Z20828 Contact with and (suspected) exposure to other viral communicable diseases: Secondary | ICD-10-CM | POA: Diagnosis not present

## 2021-10-12 DIAGNOSIS — Z20828 Contact with and (suspected) exposure to other viral communicable diseases: Secondary | ICD-10-CM | POA: Diagnosis not present

## 2021-10-15 DIAGNOSIS — Z0000 Encounter for general adult medical examination without abnormal findings: Secondary | ICD-10-CM | POA: Diagnosis not present

## 2021-10-15 DIAGNOSIS — I10 Essential (primary) hypertension: Secondary | ICD-10-CM | POA: Diagnosis not present

## 2021-10-15 DIAGNOSIS — Z01419 Encounter for gynecological examination (general) (routine) without abnormal findings: Secondary | ICD-10-CM | POA: Diagnosis not present

## 2021-10-15 DIAGNOSIS — E1169 Type 2 diabetes mellitus with other specified complication: Secondary | ICD-10-CM | POA: Diagnosis not present

## 2021-10-15 DIAGNOSIS — Z91030 Bee allergy status: Secondary | ICD-10-CM | POA: Diagnosis not present

## 2021-10-15 DIAGNOSIS — R5383 Other fatigue: Secondary | ICD-10-CM | POA: Diagnosis not present

## 2021-10-15 DIAGNOSIS — D751 Secondary polycythemia: Secondary | ICD-10-CM | POA: Diagnosis not present

## 2021-10-15 DIAGNOSIS — E7800 Pure hypercholesterolemia, unspecified: Secondary | ICD-10-CM | POA: Diagnosis not present

## 2021-10-18 DIAGNOSIS — I10 Essential (primary) hypertension: Secondary | ICD-10-CM | POA: Diagnosis not present

## 2021-10-18 DIAGNOSIS — E663 Overweight: Secondary | ICD-10-CM | POA: Diagnosis not present

## 2021-10-18 DIAGNOSIS — D751 Secondary polycythemia: Secondary | ICD-10-CM | POA: Diagnosis not present

## 2021-10-19 DIAGNOSIS — Z20822 Contact with and (suspected) exposure to covid-19: Secondary | ICD-10-CM | POA: Diagnosis not present

## 2021-10-22 DIAGNOSIS — Z20822 Contact with and (suspected) exposure to covid-19: Secondary | ICD-10-CM | POA: Diagnosis not present

## 2021-10-28 DIAGNOSIS — E2740 Unspecified adrenocortical insufficiency: Secondary | ICD-10-CM | POA: Diagnosis not present

## 2021-10-28 DIAGNOSIS — R7303 Prediabetes: Secondary | ICD-10-CM | POA: Diagnosis not present

## 2021-10-28 DIAGNOSIS — I471 Supraventricular tachycardia: Secondary | ICD-10-CM | POA: Diagnosis not present

## 2021-10-28 DIAGNOSIS — I10 Essential (primary) hypertension: Secondary | ICD-10-CM | POA: Diagnosis not present

## 2021-10-28 DIAGNOSIS — D751 Secondary polycythemia: Secondary | ICD-10-CM | POA: Diagnosis not present

## 2021-11-09 DIAGNOSIS — Z20822 Contact with and (suspected) exposure to covid-19: Secondary | ICD-10-CM | POA: Diagnosis not present

## 2021-11-10 DIAGNOSIS — Z20822 Contact with and (suspected) exposure to covid-19: Secondary | ICD-10-CM | POA: Diagnosis not present

## 2021-11-12 DIAGNOSIS — Z20822 Contact with and (suspected) exposure to covid-19: Secondary | ICD-10-CM | POA: Diagnosis not present

## 2021-11-16 DIAGNOSIS — Z20822 Contact with and (suspected) exposure to covid-19: Secondary | ICD-10-CM | POA: Diagnosis not present

## 2021-11-23 DIAGNOSIS — Z20822 Contact with and (suspected) exposure to covid-19: Secondary | ICD-10-CM | POA: Diagnosis not present

## 2021-12-07 DIAGNOSIS — Z20822 Contact with and (suspected) exposure to covid-19: Secondary | ICD-10-CM | POA: Diagnosis not present

## 2021-12-08 DIAGNOSIS — Z20822 Contact with and (suspected) exposure to covid-19: Secondary | ICD-10-CM | POA: Diagnosis not present

## 2021-12-11 DIAGNOSIS — Z20828 Contact with and (suspected) exposure to other viral communicable diseases: Secondary | ICD-10-CM | POA: Diagnosis not present

## 2021-12-13 DIAGNOSIS — Z20822 Contact with and (suspected) exposure to covid-19: Secondary | ICD-10-CM | POA: Diagnosis not present

## 2021-12-14 DIAGNOSIS — Z23 Encounter for immunization: Secondary | ICD-10-CM | POA: Diagnosis not present

## 2021-12-14 DIAGNOSIS — Z20822 Contact with and (suspected) exposure to covid-19: Secondary | ICD-10-CM | POA: Diagnosis not present

## 2021-12-15 DIAGNOSIS — Z20822 Contact with and (suspected) exposure to covid-19: Secondary | ICD-10-CM | POA: Diagnosis not present

## 2022-02-22 DIAGNOSIS — Z1231 Encounter for screening mammogram for malignant neoplasm of breast: Secondary | ICD-10-CM | POA: Diagnosis not present

## 2022-03-17 DIAGNOSIS — H52221 Regular astigmatism, right eye: Secondary | ICD-10-CM | POA: Diagnosis not present

## 2022-03-17 DIAGNOSIS — H524 Presbyopia: Secondary | ICD-10-CM | POA: Diagnosis not present

## 2022-03-17 DIAGNOSIS — Z961 Presence of intraocular lens: Secondary | ICD-10-CM | POA: Diagnosis not present

## 2022-03-17 DIAGNOSIS — H43813 Vitreous degeneration, bilateral: Secondary | ICD-10-CM | POA: Diagnosis not present

## 2022-04-19 DIAGNOSIS — E663 Overweight: Secondary | ICD-10-CM | POA: Diagnosis not present

## 2022-04-19 DIAGNOSIS — I10 Essential (primary) hypertension: Secondary | ICD-10-CM | POA: Diagnosis not present

## 2022-04-19 DIAGNOSIS — D751 Secondary polycythemia: Secondary | ICD-10-CM | POA: Diagnosis not present

## 2022-05-02 DIAGNOSIS — R079 Chest pain, unspecified: Secondary | ICD-10-CM | POA: Diagnosis not present

## 2022-05-02 DIAGNOSIS — I471 Supraventricular tachycardia: Secondary | ICD-10-CM | POA: Diagnosis not present

## 2022-05-02 DIAGNOSIS — I10 Essential (primary) hypertension: Secondary | ICD-10-CM | POA: Diagnosis not present

## 2022-05-02 DIAGNOSIS — D751 Secondary polycythemia: Secondary | ICD-10-CM | POA: Diagnosis not present

## 2022-05-02 DIAGNOSIS — Z0000 Encounter for general adult medical examination without abnormal findings: Secondary | ICD-10-CM | POA: Diagnosis not present

## 2022-05-02 DIAGNOSIS — E2740 Unspecified adrenocortical insufficiency: Secondary | ICD-10-CM | POA: Diagnosis not present

## 2022-05-02 DIAGNOSIS — R7303 Prediabetes: Secondary | ICD-10-CM | POA: Diagnosis not present

## 2022-05-02 DIAGNOSIS — R002 Palpitations: Secondary | ICD-10-CM | POA: Diagnosis not present

## 2022-05-20 DIAGNOSIS — R82998 Other abnormal findings in urine: Secondary | ICD-10-CM | POA: Diagnosis not present

## 2022-05-20 DIAGNOSIS — R300 Dysuria: Secondary | ICD-10-CM | POA: Diagnosis not present

## 2022-05-20 DIAGNOSIS — R319 Hematuria, unspecified: Secondary | ICD-10-CM | POA: Diagnosis not present

## 2022-07-02 DIAGNOSIS — N3000 Acute cystitis without hematuria: Secondary | ICD-10-CM | POA: Diagnosis not present

## 2022-07-02 DIAGNOSIS — R300 Dysuria: Secondary | ICD-10-CM | POA: Diagnosis not present
